# Patient Record
Sex: Male | Born: 1965 | Race: Black or African American | Hispanic: No | Marital: Single | Smoking: Never smoker
Health system: Southern US, Community
[De-identification: ages and names within clinical notes are randomized; demographics above are authoritative.]

## PROBLEM LIST (undated history)

## (undated) HISTORY — PX: KNEE SURGERY: SHX244

---

## 2019-03-24 ENCOUNTER — Emergency Department (HOSPITAL_COMMUNITY): Payer: Medicare (Managed Care)

## 2019-03-24 ENCOUNTER — Encounter (HOSPITAL_COMMUNITY): Payer: Self-pay | Admitting: Emergency Medicine

## 2019-03-24 ENCOUNTER — Other Ambulatory Visit: Payer: Self-pay

## 2019-03-24 ENCOUNTER — Emergency Department (HOSPITAL_COMMUNITY)
Admission: EM | Admit: 2019-03-24 | Discharge: 2019-03-24 | Disposition: A | Discharge status: Home / Self Care | Payer: Medicare (Managed Care) | Attending: Emergency Medicine | Admitting: Emergency Medicine

## 2019-03-24 DIAGNOSIS — M79671 Pain in right foot: Secondary | ICD-10-CM | POA: Insufficient documentation

## 2019-03-24 DIAGNOSIS — M19071 Primary osteoarthritis, right ankle and foot: Secondary | ICD-10-CM | POA: Diagnosis not present

## 2019-03-24 MED ORDER — IBUPROFEN 400 MG PO TABS
400.0000 mg | ORAL_TABLET | Freq: Once | ORAL | Status: AC
  Administered 2019-03-24: 400 mg via ORAL
  Filled 2019-03-24: qty 1

## 2019-03-24 MED ORDER — IBUPROFEN 600 MG PO TABS: 600.0000 mg | ORAL_TABLET | Freq: Four times a day (QID) | ORAL | 0 refills | Status: DC | PRN

## 2019-03-24 NOTE — ED Notes (Signed)
Pt ambulated to the restroom without assistance

## 2019-03-24 NOTE — ED Triage Notes (Signed)
Pt. Stated, My rt. Heel of my foot has been hurting for 2 weeks.

## 2019-03-24 NOTE — ED Provider Notes (Signed)
[facility] EMERGENCY DEPARTMENT Provider Note   CSN: [id number] Arrival date & time: 03/24/19  1417     History   Chief Complaint Chief Complaint  Patient presents with  . Foot Pain    HPI Dalton Alvarado is a 53 y.o. male.     53 year old male presents with 2-week history of right heel pain which is been atraumatic.  Similar symptoms in the past treated with steroid injections.  Denies any history of gout.  No pain or swelling to his ankle or foot.  Pain is worse with standing and better with rest no treatment used prior to arrival.     History reviewed. No pertinent past medical history.  There are no active problems to display for this patient.   History reviewed. No pertinent surgical history.      Home Medications    Prior to Admission medications   Not on File    Family History No family history on file.  Social History Social History   Tobacco Use  . Smoking status: Never Smoker  . Smokeless tobacco: Never Used  Substance Use Topics  . Alcohol use: Yes  . Drug use: Not Currently     Allergies   Patient has no allergy information on record.   Review of Systems Review of Systems  All other systems reviewed and are negative.    Physical Exam Updated Vital Signs BP (!) 128/96 (BP Location: Left Arm)   Pulse 87   Temp 98.3 F (36.8 C)   Resp 16   SpO2 100%   Physical Exam Vitals signs and nursing note reviewed.  Constitutional:      Appearance: He is well-developed. He is not toxic-appearing.  HENT:     Head: Normocephalic and atraumatic.  Eyes:     Conjunctiva/sclera: Conjunctivae normal.     Pupils: Pupils are equal, round, and reactive to light.  Neck:     Musculoskeletal: Normal range of motion.  Cardiovascular:     Rate and Rhythm: Normal rate.  Pulmonary:     Effort: Pulmonary effort is normal.  Musculoskeletal:       Feet:     Comments: No erythema or warmness to the touch.  Skin intact.  No  swelling to the ankle joint.  Skin:    General: Skin is warm and dry.  Neurological:     Mental Status: He is alert and oriented to person, place, and time.      ED Treatments / Results  Labs (all labs ordered are listed, but only abnormal results are displayed) Labs Reviewed - No data to display  EKG None  Radiology Dg Foot Complete Right  Result Date: 03/24/2019 CLINICAL DATA:  Pain in heel for 2 weeks.  No injury. EXAM: RIGHT FOOT COMPLETE - 3+ VIEW COMPARISON:  No P FINDINGS: Diffuse degenerative change. Degenerative changes most prominent about the first metatarsophalangeal joint. No acute bony abnormality identified. Calcaneus is intact. No radiopaque foreign body. IMPRESSION: Diffuse degenerative change. Degenerative changes most prior about the first metatarsophalangeal joint. No acute abnormality. The calcaneus appears unremarkable. Electronically Signed   By: Marcello Moores  Register   On: 03/24/2019 16:19    Procedures Procedures (including critical care time)  Medications Ordered in ED Medications - No data to display   Initial Impression / Assessment and Plan / ED Course  I have reviewed the triage vital signs and the nursing notes.  Pertinent labs & imaging results that were available during my care of the patient  were reviewed by me and considered in my medical decision making (see chart for details).        X-rays negative.  Will prescribe NSAIDs and give orthopedic referral  Final Clinical Impressions(s) / ED Diagnoses   Final diagnoses:  None    ED Discharge Orders    None       Lacretia Leigh, MD 03/24/19 [phone]

## 2019-05-02 ENCOUNTER — Encounter (HOSPITAL_COMMUNITY): Payer: Self-pay | Admitting: *Deleted

## 2019-05-02 ENCOUNTER — Emergency Department (HOSPITAL_COMMUNITY)
Admission: EM | Admit: 2019-05-02 | Discharge: 2019-05-02 | Disposition: A | Discharge status: Home / Self Care | Payer: PRIVATE HEALTH INSURANCE | Attending: Emergency Medicine | Admitting: Emergency Medicine

## 2019-05-02 ENCOUNTER — Other Ambulatory Visit: Payer: Self-pay

## 2019-05-02 DIAGNOSIS — H1132 Conjunctival hemorrhage, left eye: Secondary | ICD-10-CM | POA: Diagnosis not present

## 2019-05-02 DIAGNOSIS — Y929 Unspecified place or not applicable: Secondary | ICD-10-CM | POA: Insufficient documentation

## 2019-05-02 DIAGNOSIS — R2242 Localized swelling, mass and lump, left lower limb: Secondary | ICD-10-CM | POA: Diagnosis not present

## 2019-05-02 DIAGNOSIS — M79662 Pain in left lower leg: Secondary | ICD-10-CM | POA: Insufficient documentation

## 2019-05-02 DIAGNOSIS — Y999 Unspecified external cause status: Secondary | ICD-10-CM | POA: Insufficient documentation

## 2019-05-02 DIAGNOSIS — Y9389 Activity, other specified: Secondary | ICD-10-CM | POA: Diagnosis not present

## 2019-05-02 MED ORDER — OXYCODONE-ACETAMINOPHEN 5-325 MG PO TABS: 1.0000 | ORAL_TABLET | Freq: Four times a day (QID) | ORAL | 0 refills | Status: DC | PRN

## 2019-05-02 MED ORDER — ENOXAPARIN SODIUM 120 MG/0.8ML ~~LOC~~ SOLN
110.0000 mg | Freq: Once | SUBCUTANEOUS | Status: AC
  Administered 2019-05-02: 110 mg via SUBCUTANEOUS
  Filled 2019-05-02: qty 0.73

## 2019-05-02 MED ORDER — OXYCODONE-ACETAMINOPHEN 5-325 MG PO TABS
1.0000 | ORAL_TABLET | Freq: Once | ORAL | Status: AC
  Administered 2019-05-02: 1 via ORAL
  Filled 2019-05-02: qty 1
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
## 2019-05-03 ENCOUNTER — Encounter (HOSPITAL_COMMUNITY): Payer: Self-pay | Admitting: Emergency Medicine

## 2019-05-03 ENCOUNTER — Ambulatory Visit (HOSPITAL_COMMUNITY)
Admission: RE | Admit: 2019-05-03 | Discharge: 2019-05-03 | Disposition: A | Discharge status: Home / Self Care | Payer: Medicare (Managed Care) | Source: Ambulatory Visit | Attending: Emergency Medicine | Admitting: Emergency Medicine

## 2019-05-03 ENCOUNTER — Emergency Department (HOSPITAL_COMMUNITY)
Admission: EM | Admit: 2019-05-03 | Discharge: 2019-05-03 | Disposition: A | Discharge status: Home / Self Care | Payer: Medicare (Managed Care) | Attending: Emergency Medicine | Admitting: Emergency Medicine

## 2019-05-03 DIAGNOSIS — Y999 Unspecified external cause status: Secondary | ICD-10-CM | POA: Diagnosis not present

## 2019-05-03 DIAGNOSIS — S86912A Strain of unspecified muscle(s) and tendon(s) at lower leg level, left leg, initial encounter: Secondary | ICD-10-CM | POA: Diagnosis not present

## 2019-05-03 DIAGNOSIS — M7989 Other specified soft tissue disorders: Secondary | ICD-10-CM | POA: Diagnosis not present

## 2019-05-03 DIAGNOSIS — M79604 Pain in right leg: Secondary | ICD-10-CM | POA: Diagnosis present

## 2019-05-03 DIAGNOSIS — M79609 Pain in unspecified limb: Secondary | ICD-10-CM

## 2019-05-03 DIAGNOSIS — S8992XA Unspecified injury of left lower leg, initial encounter: Secondary | ICD-10-CM | POA: Diagnosis present

## 2019-05-03 DIAGNOSIS — Y929 Unspecified place or not applicable: Secondary | ICD-10-CM | POA: Insufficient documentation

## 2019-05-03 DIAGNOSIS — Y939 Activity, unspecified: Secondary | ICD-10-CM | POA: Diagnosis not present

## 2019-05-03 DIAGNOSIS — T148XXA Other injury of unspecified body region, initial encounter: Secondary | ICD-10-CM

## 2019-05-03 DIAGNOSIS — M79605 Pain in left leg: Secondary | ICD-10-CM

## 2019-05-03 NOTE — Discharge Instructions (Addendum)
You have been diagnosed today with left leg pain, muscle strain.  At this time there does not appear to be the presence of an emergent medical condition, however there is always the potential for conditions to change. Please read and follow the below instructions.  Please return to the Emergency Department immediately for any new or worsening symptoms. Please be sure to follow up with your Primary Care Provider within one week regarding your visit today; please call their office to schedule an appointment even if you are feeling better for a follow-up visit. Please use rest, ice and elevation to help with your symptoms.  Call your primary care doctor to establish a follow-up visit within 1 week.  You may use over-the-counter Tylenol and ibuprofen as directed on the packaging to help with your symptoms.  Get help right away if: You have any of these problems in your injured area: You have numbness. You have tingling. You lose a lot of strength. You have fever or chills You have redness or drainage You have any new/concerning or worsening symptoms.  Please read the additional information packets attached to your discharge summary.  Do not take your medicine if  develop an itchy rash, swelling in your mouth or lips, or difficulty breathing; call 911 and seek immediate emergency medical attention if this occurs.

## 2019-05-03 NOTE — ED Triage Notes (Signed)
Pt. Stated, I was here yesrterday for left lower leg pain , had a study this morning and it was negative. I would like to have some blood test.
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
## 2019-05-03 NOTE — Progress Notes (Signed)
Lower extremity venous has been completed.   Preliminary results in CV Proc.   Abram Sander 05/03/2019 11:15 AM

## 2019-05-03 NOTE — ED Notes (Signed)
Patient verbalizes understanding of discharge instructions. Opportunity for questioning and answers were provided. Armband removed by staff, pt discharged from ED.  

## 2019-06-05 DIAGNOSIS — M25571 Pain in right ankle and joints of right foot: Secondary | ICD-10-CM | POA: Diagnosis not present

## 2019-06-05 DIAGNOSIS — M25561 Pain in right knee: Secondary | ICD-10-CM | POA: Diagnosis not present

## 2019-06-11 DIAGNOSIS — M722 Plantar fascial fibromatosis: Secondary | ICD-10-CM | POA: Diagnosis not present

## 2019-06-26 DIAGNOSIS — R072 Precordial pain: Secondary | ICD-10-CM | POA: Diagnosis not present

## 2019-06-26 DIAGNOSIS — Z1389 Encounter for screening for other disorder: Secondary | ICD-10-CM | POA: Diagnosis not present

## 2019-06-26 DIAGNOSIS — Z0000 Encounter for general adult medical examination without abnormal findings: Secondary | ICD-10-CM | POA: Diagnosis not present

## 2019-06-26 DIAGNOSIS — Z23 Encounter for immunization: Secondary | ICD-10-CM | POA: Diagnosis not present

## 2019-06-26 DIAGNOSIS — Z114 Encounter for screening for human immunodeficiency virus [HIV]: Secondary | ICD-10-CM | POA: Diagnosis not present

## 2019-06-26 DIAGNOSIS — Z1159 Encounter for screening for other viral diseases: Secondary | ICD-10-CM | POA: Diagnosis not present

## 2019-06-26 DIAGNOSIS — Z131 Encounter for screening for diabetes mellitus: Secondary | ICD-10-CM | POA: Diagnosis not present

## 2019-06-26 DIAGNOSIS — R5383 Other fatigue: Secondary | ICD-10-CM | POA: Diagnosis not present

## 2019-06-26 DIAGNOSIS — R0602 Shortness of breath: Secondary | ICD-10-CM | POA: Diagnosis not present

## 2019-06-26 DIAGNOSIS — Z7251 High risk heterosexual behavior: Secondary | ICD-10-CM | POA: Diagnosis not present

## 2019-06-26 DIAGNOSIS — A048 Other specified bacterial intestinal infections: Secondary | ICD-10-CM | POA: Diagnosis not present

## 2019-06-26 DIAGNOSIS — Z03818 Encounter for observation for suspected exposure to other biological agents ruled out: Secondary | ICD-10-CM | POA: Diagnosis not present

## 2019-06-26 DIAGNOSIS — G629 Polyneuropathy, unspecified: Secondary | ICD-10-CM | POA: Diagnosis not present

## 2019-06-26 DIAGNOSIS — E559 Vitamin D deficiency, unspecified: Secondary | ICD-10-CM | POA: Diagnosis not present

## 2019-06-26 DIAGNOSIS — E291 Testicular hypofunction: Secondary | ICD-10-CM | POA: Diagnosis not present

## 2019-06-26 DIAGNOSIS — Z125 Encounter for screening for malignant neoplasm of prostate: Secondary | ICD-10-CM | POA: Diagnosis not present

## 2019-07-03 DIAGNOSIS — Z79899 Other long term (current) drug therapy: Secondary | ICD-10-CM | POA: Diagnosis not present

## 2019-07-03 DIAGNOSIS — M25562 Pain in left knee: Secondary | ICD-10-CM | POA: Diagnosis not present

## 2019-07-03 DIAGNOSIS — F17210 Nicotine dependence, cigarettes, uncomplicated: Secondary | ICD-10-CM | POA: Diagnosis not present

## 2019-07-03 DIAGNOSIS — M129 Arthropathy, unspecified: Secondary | ICD-10-CM | POA: Diagnosis not present

## 2019-07-03 DIAGNOSIS — M25561 Pain in right knee: Secondary | ICD-10-CM | POA: Diagnosis not present

## 2019-07-08 DIAGNOSIS — R768 Other specified abnormal immunological findings in serum: Secondary | ICD-10-CM | POA: Diagnosis not present

## 2019-07-08 DIAGNOSIS — R140 Abdominal distension (gaseous): Secondary | ICD-10-CM | POA: Diagnosis not present

## 2019-07-08 DIAGNOSIS — Z1211 Encounter for screening for malignant neoplasm of colon: Secondary | ICD-10-CM | POA: Diagnosis not present

## 2019-07-10 DIAGNOSIS — E7800 Pure hypercholesterolemia, unspecified: Secondary | ICD-10-CM | POA: Diagnosis not present

## 2019-07-10 DIAGNOSIS — E559 Vitamin D deficiency, unspecified: Secondary | ICD-10-CM | POA: Diagnosis not present

## 2019-07-10 DIAGNOSIS — F17210 Nicotine dependence, cigarettes, uncomplicated: Secondary | ICD-10-CM | POA: Diagnosis not present

## 2019-07-10 DIAGNOSIS — R768 Other specified abnormal immunological findings in serum: Secondary | ICD-10-CM | POA: Diagnosis not present

## 2019-07-16 DIAGNOSIS — Z01818 Encounter for other preprocedural examination: Secondary | ICD-10-CM | POA: Diagnosis not present

## 2019-07-16 DIAGNOSIS — K635 Polyp of colon: Secondary | ICD-10-CM | POA: Diagnosis not present

## 2019-07-16 DIAGNOSIS — Z1211 Encounter for screening for malignant neoplasm of colon: Secondary | ICD-10-CM | POA: Diagnosis not present

## 2019-07-21 ENCOUNTER — Other Ambulatory Visit: Payer: Self-pay

## 2019-07-21 DIAGNOSIS — Z20822 Contact with and (suspected) exposure to covid-19: Secondary | ICD-10-CM

## 2019-07-22 LAB — NOVEL CORONAVIRUS, NAA: SARS-CoV-2, NAA: NOT DETECTED

## 2019-07-23 DIAGNOSIS — R945 Abnormal results of liver function studies: Secondary | ICD-10-CM | POA: Diagnosis not present

## 2019-07-23 DIAGNOSIS — K635 Polyp of colon: Secondary | ICD-10-CM | POA: Diagnosis not present

## 2019-07-23 DIAGNOSIS — Z79899 Other long term (current) drug therapy: Secondary | ICD-10-CM | POA: Diagnosis not present

## 2019-07-23 DIAGNOSIS — F17210 Nicotine dependence, cigarettes, uncomplicated: Secondary | ICD-10-CM | POA: Diagnosis not present

## 2019-07-23 DIAGNOSIS — M25562 Pain in left knee: Secondary | ICD-10-CM | POA: Diagnosis not present

## 2019-07-23 DIAGNOSIS — M25561 Pain in right knee: Secondary | ICD-10-CM | POA: Diagnosis not present

## 2019-07-23 DIAGNOSIS — B182 Chronic viral hepatitis C: Secondary | ICD-10-CM | POA: Diagnosis not present

## 2019-08-05 DIAGNOSIS — R768 Other specified abnormal immunological findings in serum: Secondary | ICD-10-CM | POA: Diagnosis not present

## 2019-08-05 DIAGNOSIS — B182 Chronic viral hepatitis C: Secondary | ICD-10-CM | POA: Diagnosis not present

## 2019-08-05 DIAGNOSIS — D126 Benign neoplasm of colon, unspecified: Secondary | ICD-10-CM | POA: Diagnosis not present

## 2019-08-27 DIAGNOSIS — F17210 Nicotine dependence, cigarettes, uncomplicated: Secondary | ICD-10-CM | POA: Diagnosis not present

## 2019-08-27 DIAGNOSIS — Z79899 Other long term (current) drug therapy: Secondary | ICD-10-CM | POA: Diagnosis not present

## 2019-08-27 DIAGNOSIS — M25561 Pain in right knee: Secondary | ICD-10-CM | POA: Diagnosis not present

## 2019-08-27 DIAGNOSIS — M25562 Pain in left knee: Secondary | ICD-10-CM | POA: Diagnosis not present

## 2019-08-27 DIAGNOSIS — B182 Chronic viral hepatitis C: Secondary | ICD-10-CM | POA: Diagnosis not present

## 2019-08-27 DIAGNOSIS — F1490 Cocaine use, unspecified, uncomplicated: Secondary | ICD-10-CM | POA: Diagnosis not present

## 2019-09-24 DIAGNOSIS — B182 Chronic viral hepatitis C: Secondary | ICD-10-CM | POA: Diagnosis not present

## 2019-09-25 DIAGNOSIS — M170 Bilateral primary osteoarthritis of knee: Secondary | ICD-10-CM | POA: Diagnosis not present

## 2020-02-14 ENCOUNTER — Emergency Department (HOSPITAL_COMMUNITY): Payer: Medicare HMO

## 2020-02-14 ENCOUNTER — Encounter (HOSPITAL_COMMUNITY): Payer: Self-pay | Admitting: *Deleted

## 2020-02-14 ENCOUNTER — Emergency Department (HOSPITAL_COMMUNITY)
Admission: EM | Admit: 2020-02-14 | Discharge: 2020-02-15 | Disposition: A | Discharge status: Home / Self Care | Payer: Medicare HMO | Attending: Emergency Medicine | Admitting: Emergency Medicine

## 2020-02-14 ENCOUNTER — Other Ambulatory Visit: Payer: Self-pay

## 2020-02-14 DIAGNOSIS — Y9389 Activity, other specified: Secondary | ICD-10-CM | POA: Diagnosis not present

## 2020-02-14 DIAGNOSIS — Y998 Other external cause status: Secondary | ICD-10-CM | POA: Diagnosis not present

## 2020-02-14 DIAGNOSIS — M25561 Pain in right knee: Secondary | ICD-10-CM | POA: Diagnosis not present

## 2020-02-14 DIAGNOSIS — M7918 Myalgia, other site: Secondary | ICD-10-CM

## 2020-02-14 DIAGNOSIS — Y92410 Unspecified street and highway as the place of occurrence of the external cause: Secondary | ICD-10-CM | POA: Insufficient documentation

## 2020-02-14 DIAGNOSIS — M25512 Pain in left shoulder: Secondary | ICD-10-CM | POA: Diagnosis present

## 2020-02-14 DIAGNOSIS — Z79899 Other long term (current) drug therapy: Secondary | ICD-10-CM | POA: Diagnosis not present

## 2020-02-14 MED ORDER — KETOROLAC TROMETHAMINE 15 MG/ML IJ SOLN
15.0000 mg | Freq: Once | INTRAMUSCULAR | Status: DC
  Filled 2020-02-14: qty 1

## 2020-02-14 MED ORDER — HYDROMORPHONE HCL 1 MG/ML IJ SOLN
1.0000 mg | Freq: Once | INTRAMUSCULAR | Status: AC
  Administered 2020-02-14: 1 mg via INTRAVENOUS
  Filled 2020-02-14: qty 1

## 2020-02-14 MED ORDER — CYCLOBENZAPRINE HCL 10 MG PO TABS: 10.0000 mg | ORAL_TABLET | Freq: Three times a day (TID) | ORAL | 0 refills | Status: AC | PRN

## 2020-02-14 MED ORDER — KETOROLAC TROMETHAMINE 15 MG/ML IJ SOLN
15.0000 mg | Freq: Once | INTRAMUSCULAR | Status: AC
  Administered 2020-02-14: 15 mg via INTRAVENOUS

## 2020-02-14 NOTE — ED Notes (Signed)
Pt. Transported to xray 
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
## 2020-02-14 NOTE — Discharge Instructions (Signed)
Take ibuprofen 600 mg every 6 hours as needed for pain. Take flexeril as needed as a muscle relaxant.
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
## 2020-04-12 ENCOUNTER — Encounter: Payer: Self-pay | Admitting: Emergency Medicine

## 2020-04-12 ENCOUNTER — Other Ambulatory Visit: Payer: Self-pay

## 2020-04-12 ENCOUNTER — Emergency Department: Payer: Medicare HMO

## 2020-04-12 ENCOUNTER — Emergency Department
Admission: EM | Admit: 2020-04-12 | Discharge: 2020-04-12 | Disposition: A | Discharge status: Home / Self Care | Payer: Medicare HMO | Attending: Emergency Medicine | Admitting: Emergency Medicine

## 2020-04-12 DIAGNOSIS — T40601A Poisoning by unspecified narcotics, accidental (unintentional), initial encounter: Secondary | ICD-10-CM | POA: Insufficient documentation

## 2020-04-12 DIAGNOSIS — Z859 Personal history of malignant neoplasm, unspecified: Secondary | ICD-10-CM | POA: Insufficient documentation

## 2020-04-12 DIAGNOSIS — R464 Slowness and poor responsiveness: Secondary | ICD-10-CM | POA: Diagnosis present

## 2020-04-12 LAB — CBC
HCT: 35.9 % — ABNORMAL LOW (ref 39.0–52.0)
Hemoglobin: 11 g/dL — ABNORMAL LOW (ref 13.0–17.0)
MCH: 27.5 pg (ref 26.0–34.0)
MCHC: 30.6 g/dL (ref 30.0–36.0)
MCV: 89.8 fL (ref 80.0–100.0)
Platelets: 110 10*3/uL — ABNORMAL LOW (ref 150–400)
RBC: 4 MIL/uL — ABNORMAL LOW (ref 4.22–5.81)
RDW: 14.6 % (ref 11.5–15.5)
WBC: 5 10*3/uL (ref 4.0–10.5)
nRBC: 0 % (ref 0.0–0.2)

## 2020-04-12 LAB — URINALYSIS, COMPLETE (UACMP) WITH MICROSCOPIC
Bacteria, UA: NONE SEEN
Bilirubin Urine: NEGATIVE
Glucose, UA: NEGATIVE mg/dL
Hgb urine dipstick: NEGATIVE
Ketones, ur: NEGATIVE mg/dL
Leukocytes,Ua: NEGATIVE
Nitrite: NEGATIVE
Protein, ur: NEGATIVE mg/dL
Specific Gravity, Urine: 1.004 — ABNORMAL LOW (ref 1.005–1.030)
Squamous Epithelial / HPF: NONE SEEN (ref 0–5)
pH: 7 (ref 5.0–8.0)

## 2020-04-12 LAB — COMPREHENSIVE METABOLIC PANEL
ALT: 17 U/L (ref 0–44)
AST: 24 U/L (ref 15–41)
Albumin: 3.7 g/dL (ref 3.5–5.0)
Alkaline Phosphatase: 52 U/L (ref 38–126)
Anion gap: 11 (ref 5–15)
BUN: 10 mg/dL (ref 6–20)
CO2: 27 mmol/L (ref 22–32)
Calcium: 8.5 mg/dL — ABNORMAL LOW (ref 8.9–10.3)
Chloride: 100 mmol/L (ref 98–111)
Creatinine, Ser: 1.18 mg/dL (ref 0.61–1.24)
GFR calc Af Amer: >60 mL/min (ref >60)
GFR calc non Af Amer: >60 mL/min (ref >60)
Glucose, Bld: 153 mg/dL — ABNORMAL HIGH (ref 70–99)
Potassium: 3.9 mmol/L (ref 3.5–5.1)
Sodium: 138 mmol/L (ref 135–145)
Total Bilirubin: 0.7 mg/dL (ref 0.3–1.2)
Total Protein: 6.1 g/dL — ABNORMAL LOW (ref 6.5–8.1)

## 2020-04-12 MED ORDER — SODIUM CHLORIDE 0.9 % IV BOLUS
1000.0000 mL | Freq: Once | INTRAVENOUS | Status: AC
  Administered 2020-04-12: 1000 mL via INTRAVENOUS

## 2020-04-12 NOTE — ED Notes (Signed)
Pt using urinal.

## 2020-04-12 NOTE — ED Provider Notes (Signed)
[facility] Emergency Department Provider Note  Time seen: 7:52 AM  I have reviewed the triage vital signs and the nursing notes.   HISTORY  Chief Complaint unresponsive   HPI Dalton Alvarado is a 54 y.o. 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  According to EMS patient was found unresponsive at home by family members on the floor.  Patient was noted to be on methadone.  EMS states upon their arrival he was completely unresponsive and was given 1 mg of intranasal Narcan.  In route to the hospital patient became responsive.  Initially upon arrival patient is somnolent but responding to light stimuli or strong verbal stimuli.  Within approximately 5 minutes of arrival patient is sitting up in bed asking to go to the bathroom.  Patient denies any complaints.  Largely negative review of systems.  Does not recall falling.  History reviewed. No pertinent past medical history.  There are no problems to display for this patient.   Past Surgical History:  Procedure Laterality Date  . KNEE SURGERY      Prior to Admission medications   Medication Sig Start Date End Date Taking? Authorizing Provider  Cholecalciferol (VITAMIN D3) 1.25 MG (50000 UT) CAPS Take 1 capsule by mouth once a week. 02/05/20   [provider]  cyclobenzaprine (FLEXERIL) 10 MG tablet Take 1 tablet (10 mg total) by mouth 3 (three) times daily as needed for muscle spasms. 02/14/20   Virgel Manifold, MD  EPCLUSA 400-100 MG TABS Take 1 tablet by mouth daily. 12/29/19   [provider]  ibuprofen (ADVIL) 600 MG tablet Take 1 tablet (600 mg total) by mouth every 6 (six) hours as needed. Patient not taking: Reported on 02/14/2020 03/24/19   Lacretia Leigh, MD  oxyCODONE-acetaminophen (PERCOCET/ROXICET) 5-325 MG tablet Take 1 tablet by mouth every 6 (six) hours as needed for severe pain. 05/02/19   Ashley Murrain, NP  sildenafil (VIAGRA) 50 MG tablet Take 50 mg by mouth as needed for erectile dysfunction. 12/17/19   [provider]    No Known Allergies  History reviewed. No pertinent family history.  Social History Social History   Tobacco Use  . Smoking status: Never Smoker  . Smokeless tobacco: Never Used  Substance Use Topics  . Alcohol use: Yes  . Drug use: Not Currently    Review of Systems Constitutional: Negative for fever. Cardiovascular: Negative for chest pain. Respiratory: Negative for shortness of breath. Gastrointestinal: Negative for abdominal pain Musculoskeletal: Negative for musculoskeletal complaints Neurological: Negative for headache All other ROS negative  ____________________________________________   PHYSICAL EXAM:  VITAL SIGNS: ED Triage Vitals [04/12/20 0728]  Enc Vitals Group     BP 125/82     Pulse Rate 89     Resp 13     Temp 98.5 F (36.9 C)     Temp Source Oral     SpO2 96 %     Weight      Height      Head Circumference      Peak Flow      Pain Score Asleep     Pain Loc      Pain Edu?      Excl. in [place]?    Constitutional: Alert and oriented. Well appearing and in no distress. Eyes: Normal exam ENT      Head: Normocephalic and atraumatic.      Mouth/Throat: Mucous  membranes are moist. Cardiovascular: Normal rate, regular rhythm.  Respiratory: Normal respiratory effort without tachypnea nor retractions. Breath sounds are clear  Gastrointestinal: Soft and nontender. No distention.   Musculoskeletal: Nontender with normal range of motion in all extremities.  Neurologic:  Normal speech and language. No gross focal neurologic deficits  Skin:  Skin is warm, dry and intact.  Psychiatric: Mood and affect are normal.  ____________________________________________    EKG  EKG viewed and interpreted by myself shows a normal sinus rhythm at 95 bpm with a narrow QRS, normal axis, normal intervals, no concerning ST  changes.  ____________________________________________    RADIOLOGY  CT head negative for acute abnormality.  ____________________________________________   INITIAL IMPRESSION / ASSESSMENT AND PLAN / ED COURSE  Pertinent labs & imaging results that were available during my care of the patient were reviewed by me and considered in my medical decision making (see chart for details).   Patient presents to the emergency department after being found down at home.  After 1 mg of intranasal Narcan patient is now awake and responsive in the emergency department with no complaints.  We will check labs, CT scan of the head as a precaution as well as a urine sample.  We will continue to closely monitor given the patient received short acting Narcan and is apparently on long-acting methadone.  CT scan is negative for acute abnormality.  Lab work largely [place].  Patient has requested multiple times now to leave the emergency department.  Patient was initially somnolent however is now been awake for several hours.  Patient's family is here with him who will take him home and watch him.  We will discharge from the emergency department.  Dalton Alvarado was evaluated in Emergency Department on 04/12/2020 for the symptoms described in the history of present illness. He was evaluated in the context of the global COVID-19 pandemic, which necessitated consideration that the patient might be at risk for infection with the SARS-CoV-2 virus that causes COVID-19. Institutional protocols and algorithms that pertain to the evaluation of patients at risk for COVID-19 are in a state of rapid change based on information released by regulatory bodies including the CDC and federal and state organizations. These policies and algorithms were followed during the patient's care in the ED.  ____________________________________________   FINAL CLINICAL IMPRESSION(S) / ED DIAGNOSES  Unresponsiveness Accidental opiate  overdose   Dalton Dark, MD 04/12/20 1158

## 2020-04-12 NOTE — ED Notes (Signed)
Assisted pt with urinal

## 2020-04-12 NOTE — ED Notes (Signed)
Pt using urinal.  Keeps falling asleep while using then wakes up and has to urinate again.  Family called for pt, will have pt call family or RN will once pt able to give RN permission to speak with them.
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
## 2020-04-12 NOTE — Discharge Instructions (Addendum)
You have been seen in the emergency department today for unresponsiveness.  Your condition is very likely due to an accidental overdose of pain medication.  Please only take your pain medication as prescribed by your provider.  Do not drink alcohol or drive while taking pain medication.  Return to the emergency department for any symptoms personally concerning to yourself or family members.

## 2020-04-12 NOTE — ED Notes (Signed)
Pt assisted with urinal

## 2020-04-12 NOTE — ED Notes (Signed)
Patient transported to CT 

## 2020-08-05 ENCOUNTER — Other Ambulatory Visit: Payer: Self-pay | Admitting: Family Medicine

## 2020-08-05 DIAGNOSIS — R1033 Periumbilical pain: Secondary | ICD-10-CM

## 2020-10-12 ENCOUNTER — Other Ambulatory Visit: Payer: Self-pay | Admitting: Family Medicine

## 2020-10-12 DIAGNOSIS — S43402A Unspecified sprain of left shoulder joint, initial encounter: Secondary | ICD-10-CM

## 2021-03-23 ENCOUNTER — Other Ambulatory Visit: Payer: Self-pay

## 2021-03-23 ENCOUNTER — Ambulatory Visit
Admission: RE | Admit: 2021-03-23 | Discharge: 2021-03-23 | Disposition: A | Discharge status: Home / Self Care | Payer: Medicare HMO | Source: Ambulatory Visit | Attending: Internal Medicine | Admitting: Internal Medicine

## 2021-03-23 DIAGNOSIS — N281 Cyst of kidney, acquired: Secondary | ICD-10-CM
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
## 2022-03-21 ENCOUNTER — Ambulatory Visit: Payer: No Typology Code available for payment source | Admitting: Podiatry

## 2022-05-27 ENCOUNTER — Encounter (HOSPITAL_COMMUNITY): Payer: Self-pay | Admitting: Emergency Medicine

## 2022-05-27 ENCOUNTER — Emergency Department (HOSPITAL_COMMUNITY)
Admission: EM | Admit: 2022-05-27 | Discharge: 2022-05-27 | Discharge status: Against Medical Advice | Payer: Medicare Other | Attending: Emergency Medicine | Admitting: Emergency Medicine

## 2022-05-27 ENCOUNTER — Other Ambulatory Visit: Payer: Self-pay

## 2022-05-27 ENCOUNTER — Emergency Department (HOSPITAL_COMMUNITY): Payer: Medicare Other

## 2022-05-27 DIAGNOSIS — Y92410 Unspecified street and highway as the place of occurrence of the external cause: Secondary | ICD-10-CM | POA: Diagnosis not present

## 2022-05-27 DIAGNOSIS — Z5321 Procedure and treatment not carried out due to patient leaving prior to being seen by health care provider: Secondary | ICD-10-CM | POA: Insufficient documentation

## 2022-05-27 DIAGNOSIS — M542 Cervicalgia: Secondary | ICD-10-CM | POA: Diagnosis present

## 2022-05-27 DIAGNOSIS — M545 Low back pain, unspecified: Secondary | ICD-10-CM | POA: Diagnosis not present

## 2022-05-27 DIAGNOSIS — M25512 Pain in left shoulder: Secondary | ICD-10-CM | POA: Insufficient documentation

## 2022-05-27 DIAGNOSIS — R0781 Pleurodynia: Secondary | ICD-10-CM | POA: Insufficient documentation

## 2022-05-27 NOTE — ED Notes (Signed)
Pt left due to not being seen quick enough 
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
## 2022-08-07 ENCOUNTER — Ambulatory Visit (INDEPENDENT_AMBULATORY_CARE_PROVIDER_SITE_OTHER): Payer: Medicare Other | Admitting: Podiatry

## 2022-08-07 DIAGNOSIS — Z91199 Patient's noncompliance with other medical treatment and regimen due to unspecified reason: Secondary | ICD-10-CM

## 2022-08-07 NOTE — Progress Notes (Signed)
Patient was no-show for appointment today 

## 2022-08-09 ENCOUNTER — Other Ambulatory Visit: Payer: Self-pay | Admitting: Podiatry

## 2022-08-09 ENCOUNTER — Ambulatory Visit (INDEPENDENT_AMBULATORY_CARE_PROVIDER_SITE_OTHER): Payer: Medicare Other

## 2022-08-09 ENCOUNTER — Ambulatory Visit (INDEPENDENT_AMBULATORY_CARE_PROVIDER_SITE_OTHER): Payer: Medicare Other | Admitting: Podiatry

## 2022-08-09 DIAGNOSIS — M21619 Bunion of unspecified foot: Secondary | ICD-10-CM

## 2022-08-09 DIAGNOSIS — M2011 Hallux valgus (acquired), right foot: Secondary | ICD-10-CM | POA: Diagnosis not present

## 2022-08-09 DIAGNOSIS — Z79899 Other long term (current) drug therapy: Secondary | ICD-10-CM | POA: Diagnosis not present

## 2022-08-09 DIAGNOSIS — M7752 Other enthesopathy of left foot: Secondary | ICD-10-CM

## 2022-08-09 DIAGNOSIS — B351 Tinea unguium: Secondary | ICD-10-CM | POA: Diagnosis not present

## 2022-08-09 DIAGNOSIS — Z01818 Encounter for other preprocedural examination: Secondary | ICD-10-CM

## 2022-08-09 DIAGNOSIS — M7751 Other enthesopathy of right foot: Secondary | ICD-10-CM | POA: Diagnosis not present

## 2022-08-09 NOTE — Progress Notes (Signed)
Subjective:  Patient ID: Dalton Alvarado, male    DOB: Jun 19, 1966,  MRN: [id number]  Chief Complaint  Patient presents with   Bunions    56 y.o. male presents with the above complaint.  Patient presents with complaint bilateral first metatarsophalangeal joint capsulitis with arthritis right greater than left side.  Patient states is painful to touch is progressive gotten worse hurts with ambulation worse with pressure he has not seen anyone else prior to seeing me for this.  He is tried shoe gear modification padding protective none of which has helped.  He would like to discuss surgical options as well.  Pain scale is 8 out of 10 dull achy in nature especially on the right side.  He also has secondary complaint of bilateral hallux thickened elongated dystrophic mycotic nail for which she would like to discuss oral medication he is tried over-the-counter topical medication which has not helped   Review of Systems: Negative except as noted in the HPI. Denies N/V/F/Ch.  No past medical history on file.  Current Outpatient Medications:    Cholecalciferol (VITAMIN D3) 1.25 MG (50000 UT) CAPS, Take 1 capsule by mouth once a week., Disp: , Rfl:    cyclobenzaprine (FLEXERIL) 10 MG tablet, Take 1 tablet (10 mg total) by mouth 3 (three) times daily as needed for muscle spasms., Disp: 15 tablet, Rfl: 0   EPCLUSA 400-100 MG TABS, Take 1 tablet by mouth daily., Disp: , Rfl:    ibuprofen (ADVIL) 600 MG tablet, Take 1 tablet (600 mg total) by mouth every 6 (six) hours as needed. (Patient not taking: Reported on 02/14/2020), Disp: 30 tablet, Rfl: 0   oxyCODONE-acetaminophen (PERCOCET/ROXICET) 5-325 MG tablet, Take 1 tablet by mouth every 6 (six) hours as needed for severe pain., Disp: 6 tablet, Rfl: 0   sildenafil (VIAGRA) 50 MG tablet, Take 50 mg by mouth as needed for erectile dysfunction., Disp: , Rfl:    terbinafine (LAMISIL) 250 MG tablet, Take 1 tablet (250 mg total) by mouth daily., Disp: 90 tablet,  Rfl: 0  Social History   Tobacco Use  Smoking Status Never  Smokeless Tobacco Never    No Known Allergies Objective:  There were no vitals filed for this visit. There is no height or weight on file to calculate BMI. Constitutional Well developed. Well nourished.  Vascular Dorsalis pedis pulses palpable bilaterally. Posterior tibial pulses palpable bilaterally. Capillary refill normal to all digits.  No cyanosis or clubbing noted. Pedal hair growth normal.  Neurologic Normal speech. Oriented to person, place, and time. Epicritic sensation to light touch grossly present bilaterally.  Dermatologic Thickened elongated dystrophic mycotic toenails x 2 bilateral hallux.  Mild pain on palpation  Orthopedic: Pain on palpation bilateral first metatarsal phalangeal joint with right greater than left side crepitus clinically appreciated limited range of motion noted hallux rigidus noted.  Intra-articular first MPJ pain noted.   Radiographs: 3 views of skeletally mature adult right foot arthritis noted to the first metatarsophalangeal joint with underlying bunion deformity.  Severe pes planovalgus noted with midfoot arthritis as well as talonavicular joint arthritis.  No other bony abnormalities identified slight brachymetatarsia noted of the fourth metatarsal Assessment:   1. Long-term use of high-risk medication   2. Bunion   3. Capsulitis of metatarsophalangeal (MTP) joint of right foot   4. Capsulitis of metatarsophalangeal (MTP) joint of left foot   5. Nail fungus   6. Onychomycosis due to dermatophyte   7. Encounter for preoperative examination for general surgical procedure  Plan:  Patient was evaluated and treated and all questions answered.  Right first metatarsophalangeal joint arthritis with underlying bilateral capsulitis MTP -All questions and concerns were discussed with the patient in extensive detail.  Patient has a right first metatarsal phalangeal joint arthritis with  underlying bunion deformity I believe patient will benefit best from correction with right first metatarsophalangeal joint fusion.  Patient had limited range of motion consistent with hallux rigidus as well.  I discussed this with the patient I discussed my preoperative intra postop plan with the patient in extensive detail he states understand like to proceed with surgery -Informed surgical risk consent was reviewed and read aloud to the patient.  I reviewed the films.  I have discussed my findings with the patient in great detail.  I have discussed all risks including but not limited to infection, stiffness, scarring, limp, disability, deformity, damage to blood vessels and nerves, numbness, poor healing, need for braces, arthritis, chronic pain, amputation, death.  All benefits and realistic expectations discussed in great detail.  I have made no promises as to the outcome.  I have provided realistic expectations.  I have offered the patient a 2nd opinion, which they have declined and assured me they preferred to proceed despite the risks -Given the amount of pain that he is currently having and to the surgery patient would benefit from a steroid injection.  Patient agrees with plan like to proceed with steroid injection -A steroid injection was performed at bilateral first MTP using 1% plain Lidocaine and 10 mg of Kenalog. This was well tolerated.    Bilateral hallux onychomycosis-Educated the patient on the etiology of onychomycosis and various treatment options associated with improving the fungal load.  I explained to the patient that there is 3 treatment options available to treat the onychomycosis including topical, p.o., laser treatment.  Patient elected to undergo p.o. options with Lamisil/terbinafine therapy.  In order for me to start the medication therapy, I explained to the patient the importance of evaluating the liver and obtaining the liver function test.  Once the liver function test comes  back normal I will start him on 69-monthcourse of Lamisil therapy.  Patient understood all risk and would like to proceed with Lamisil therapy.  I have asked the patient to immediately stop the Lamisil therapy if she has any reactions to it and call the office or go to the emergency room right away.  Patient states understanding   No follow-ups on file.

## 2022-08-10 LAB — HEPATIC FUNCTION PANEL
ALT: 25 IU/L (ref 0–44)
AST: 29 IU/L (ref 0–40)
Albumin: 4.3 g/dL (ref 3.8–4.9)
Alkaline Phosphatase: 123 IU/L — ABNORMAL HIGH (ref 44–121)
Bilirubin Total: 1 mg/dL (ref 0.0–1.2)
Bilirubin, Direct: 0.29 mg/dL (ref 0.00–0.40)
Total Protein: 6.7 g/dL (ref 6.0–8.5)

## 2022-08-11 MED ORDER — TERBINAFINE HCL 250 MG PO TABS: 250.0000 mg | ORAL_TABLET | Freq: Every day | ORAL | 0 refills | Status: AC

## 2022-08-11 NOTE — Addendum Note (Signed)
Addended by: Boneta Lucks on: 08/11/2022 09:43 AM   Modules accepted: Orders

## 2022-09-01 ENCOUNTER — Ambulatory Visit: Payer: 59 | Attending: Internal Medicine

## 2022-09-01 DIAGNOSIS — M6281 Muscle weakness (generalized): Secondary | ICD-10-CM | POA: Insufficient documentation

## 2022-09-01 DIAGNOSIS — M48061 Spinal stenosis, lumbar region without neurogenic claudication: Secondary | ICD-10-CM | POA: Insufficient documentation

## 2022-09-01 DIAGNOSIS — M5459 Other low back pain: Secondary | ICD-10-CM | POA: Insufficient documentation
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
## 2022-09-13 ENCOUNTER — Ambulatory Visit: Payer: 59

## 2022-09-13 ENCOUNTER — Other Ambulatory Visit: Payer: Self-pay

## 2022-09-13 DIAGNOSIS — M48061 Spinal stenosis, lumbar region without neurogenic claudication: Secondary | ICD-10-CM | POA: Diagnosis present

## 2022-09-13 DIAGNOSIS — M6281 Muscle weakness (generalized): Secondary | ICD-10-CM | POA: Diagnosis present

## 2022-09-13 DIAGNOSIS — M5459 Other low back pain: Secondary | ICD-10-CM | POA: Diagnosis present
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
## 2022-09-18 ENCOUNTER — Ambulatory Visit: Payer: 59

## 2022-09-20 ENCOUNTER — Ambulatory Visit: Payer: 59

## 2022-09-20 DIAGNOSIS — M6281 Muscle weakness (generalized): Secondary | ICD-10-CM

## 2022-09-20 DIAGNOSIS — M48061 Spinal stenosis, lumbar region without neurogenic claudication: Secondary | ICD-10-CM

## 2022-09-20 DIAGNOSIS — M5459 Other low back pain: Secondary | ICD-10-CM | POA: Diagnosis not present

## 2022-09-20 NOTE — Therapy (Signed)
OUTPATIENT PHYSICAL THERAPY TREATMENT NOTE   Patient Name: Dalton Alvarado MRN: [id number] DOB:01/12/1966, 57 y.o., male Today's Date: 09/20/2022  PCP: Sun, Yun, MD  REFERRING PROVIDER: Sun, Yun, MD   END OF SESSION:   PT End of Session - 09/20/22 0922     Visit Number 2    Number of Visits 12    Date for PT Re-Evaluation 11/08/22    Authorization Type [employer] MCR    PT Start Time 0922   arrived late   PT Stop Time 1000    PT Time Calculation (min) 38 min    Activity Tolerance Patient limited by pain;Other (comment)   reluctant to participate in PT   Behavior During Therapy Flat affect             History reviewed. No pertinent past medical history. Past Surgical History:  Procedure Laterality Date   KNEE SURGERY     There are no problems to display for this patient.   REFERRING DIAG: M54.50 (ICD-10-CM) - Low back pain, unspecified   THERAPY DIAG:  Other low back pain  Muscle weakness (generalized)  Spinal stenosis of lumbar region without neurogenic claudication  Rationale for Evaluation and Treatment Rehabilitation  PERTINENT HISTORY: See chart   PRECAUTIONS: None   SUBJECTIVE:                                                                                                                                                                                      SUBJECTIVE STATEMENT:  Pt presents to PT with reports of continued lower back pain. Has been fairly compliant with HEP. Ready to begin PT at this time.    PAIN:  Are you having pain?  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 There is severe neuroforaminal  stenosis of L4 with compression of the L4 nerve roots.    PATIENT SURVEYS:  FOTO 44(57 predicted)   SCREENING FOR RED FLAGS: Bowel or bladder incontinence: No   COGNITION: Overall cognitive status: Within functional limits for tasks assessed                          SENSATION: Not tested   MUSCLE LENGTH: Hamstrings: Right 80 deg; Left 80 deg     POSTURE: No Significant postural limitations   PALPATION: Marked bony prominence at L4 spinous process     LUMBAR ROM:    AROM eval  Flexion 90%  Extension 25%  Right lateral flexion 50%  Left lateral flexion 50%  Right rotation 75%  Left rotation 75%   (Blank rows = not tested)   LOWER EXTREMITY ROM:   WFL   Active  Right eval Left eval  Hip flexion      Hip extension      Hip abduction      Hip adduction      Hip internal rotation      Hip external rotation      Knee flexion      Knee extension      Ankle dorsiflexion      Ankle plantarflexion      Ankle inversion      Ankle eversion       (Blank rows = not tested)   LOWER EXTREMITY MMT:     MMT Right eval Left eval  Hip flexion 4 4  Hip extension 4 4  Hip abduction 4 4  Hip adduction      Hip internal rotation      Hip external rotation      Knee flexion 4 4  Knee extension 4 4  Ankle dorsiflexion      Ankle plantarflexion 4 4  Ankle inversion      Ankle eversion       (Blank rows = not tested)   LUMBAR SPECIAL TESTS:  Straight leg raise test: Negative, Slump test: Negative, and FABER test: Negative   FUNCTIONAL TESTS:  5 times sit to stand: 25s   GAIT: Distance walked: 75ftx2 Assistive device utilized: None Level of assistance: Complete Independence Comments: slow cadence   TREATMENT: [facility] Adult PT Treatment:                                                DATE: 09/20/2022 Therapeutic Exercise: NuStep lvl 5 UE/LE x 5 min while taking subjective Physioball rollout x 10 fwd Bridge 2x10 Modified thomas stretch x 60" each Supine clamshell  2x20 black TB Supine march 2x20 black TB Supine sciatic nerve glide x 15    PATIENT EDUCATION:  Education details: continue HEP Person educated: Patient Education method: Explanation Education comprehension: verbalized understanding and needs further education   HOME EXERCISE PROGRAM: Access Code: [email or web address] URL: [email or web address] Date: 09/20/2022 Prepared by: Asa Fath  Exercises - Curl Up with Arms Crossed  - 2 x daily - 5 x weekly - 2 sets - 10 reps - Supine 90/90 Abdominal Bracing  - 2 x daily - 5 x weekly - 2 sets - 2 reps - 30s hold - Supine Bridge  - 2 x daily - 5 x weekly - 2 sets - 10 reps - Supine Double Knee to Chest  - 1 x daily - 7 x weekly - 2-3 reps - 30 sec hold - Modified Thomas Stretch  - 1 x daily - 7 x weekly - 2-3 reps - 60 sec hold - Supine Sciatic Nerve Glide  - 1 x daily - 7 x weekly - 2 sets - 10 reps   ASSESSMENT:   CLINICAL IMPRESSION: Pt was able to complete prescribed exercises with no adverse effect. Therapy focused on improving core and proximal hip strength as well as lumbar flexion stretching to reduce pain and improve comfort. HEP updated, will continue to progress as able   per POC.    OBJECTIVE IMPAIRMENTS: Abnormal gait, decreased activity tolerance, decreased endurance, decreased knowledge of condition, decreased mobility, decreased ROM, decreased strength, and pain.    ACTIVITY LIMITATIONS: carrying, lifting, bending, and standing   REHAB POTENTIAL: Fair based on chronicity and reluctance to undergo recommended surgical correction     GOALS: Goals reviewed with patient? No   SHORT TERM GOALS: Target date: 10/04/2022     Patient to demonstrate independence in HEP Baseline: [email or web address] Goal status: INITIAL   2.  Decrease 5x STS to 20s arms crossed Baseline: 25s arms crossed Goal status: INITIAL       LONG TERM GOALS: Target date: 10/25/2022   Increase FOTO score to 57 Baseline: 44 Goal status: INITIAL   2.   Increase BLE strength to 4+/5 throughout Baseline:  MMT Right eval Left eval  Hip flexion 4 4  Hip extension 4 4  Hip abduction 4 4  Hip adduction      Hip internal rotation      Hip external rotation      Knee flexion 4 4  Knee extension 4 4  Ankle dorsiflexion      Ankle plantarflexion 4 4    Goal status: INITIAL   3.  Increase lumbar ROM to 75% in all deficit areas Baseline:  AROM eval  Flexion 90%  Extension 25%  Right lateral flexion 50%  Left lateral flexion 50%  Right rotation 75%  Left rotation 75%    Goal status: INITIAL   4.  Increase core strength to 3+/5 Baseline: 3/5 Goal status: INITIAL       PLAN:   PT FREQUENCY: 2x/week   PT DURATION: 6 weeks   PLANNED INTERVENTIONS: Therapeutic exercises, Therapeutic activity, Neuromuscular re-education, Balance training, Gait training, Patient/Family education, Self Care, Joint mobilization, Aquatic Therapy, Manual therapy, and Re-evaluation.   PLAN FOR NEXT SESSION: HEP review and update, core strengthening, postural training, functional activities   Zahir Eisenhour C Radhika Dershem, PT 09/20/2022, 10:40 AM     

## 2022-09-22 NOTE — Therapy (Unsigned)
  OUTPATIENT PHYSICAL THERAPY TREATMENT NOTE   Patient Name: Dalton Alvarado MRN: [id number] DOB:03/18/1966, 57 y.o., male Today's Date: 09/22/2022  PCP: Marland Kitchen REFERRING PROVIDER: ***  END OF SESSION:    No past medical history on file. Past Surgical History:  Procedure Laterality Date   KNEE SURGERY     There are no problems to display for this patient.   REFERRING DIAG: ***  THERAPY DIAG:  No diagnosis found.  Rationale for Evaluation and Treatment {HABREHAB:[id number]}  PERTINENT HISTORY: ***  PRECAUTIONS: ***  SUBJECTIVE:                                                                                                                                                                                      SUBJECTIVE STATEMENT:  ***   PAIN:  Are you having pain? {OPRCPAIN:[id number]}   OBJECTIVE: (objective measures completed at initial evaluation unless otherwise dated)   (Copy Eval's Objective through Plan section here)   Lanice Shirts, PT 09/22/2022, 8:16 AM

## 2022-09-25 ENCOUNTER — Ambulatory Visit: Payer: 59 | Attending: Internal Medicine

## 2022-09-25 DIAGNOSIS — M5459 Other low back pain: Secondary | ICD-10-CM | POA: Insufficient documentation

## 2022-09-25 DIAGNOSIS — M6281 Muscle weakness (generalized): Secondary | ICD-10-CM | POA: Insufficient documentation

## 2022-09-25 DIAGNOSIS — M48061 Spinal stenosis, lumbar region without neurogenic claudication: Secondary | ICD-10-CM | POA: Insufficient documentation

## 2022-09-25 NOTE — Therapy (Deleted)
OUTPATIENT PHYSICAL THERAPY TREATMENT NOTE   Patient Name: Dalton Alvarado MRN: [id number] DOB:July 27, 1966, 57 y.o., male Today's Date: 09/20/2022  PCP: Sandi Mariscal, MD  REFERRING PROVIDER: Sandi Mariscal, MD   END OF SESSION:   PT End of Session - 09/20/22 0922     Visit Number 2    Number of Visits 12    Date for PT Re-Evaluation 11/08/22    Authorization Type [employer] MCR    PT Start Time [id number]   arrived late   PT Stop Time 1000    PT Time Calculation (min) 38 min    Activity Tolerance Patient limited by pain;Other (comment)   reluctant to participate in PT   Behavior During Therapy Flat affect             History reviewed. No pertinent past medical history. Past Surgical History:  Procedure Laterality Date   KNEE SURGERY     There are no problems to display for this patient.   REFERRING DIAG: M54.50 (ICD-10-CM) - Low back pain, unspecified   THERAPY DIAG:  Other low back pain  Muscle weakness (generalized)  Spinal stenosis of lumbar region without neurogenic claudication  Rationale for Evaluation and Treatment Rehabilitation  PERTINENT HISTORY: See chart   PRECAUTIONS: None   SUBJECTIVE:                                                                                                                                                                                      SUBJECTIVE STATEMENT:  Pt presents to PT with reports of continued lower back pain. Has been fairly compliant with HEP. Ready to begin PT at this time.    PAIN:  Are you having pain?  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 There is severe neuroforaminal  stenosis of L4 with compression of the L4 nerve roots.    PATIENT SURVEYS:  FOTO 44(57 predicted)   SCREENING FOR RED FLAGS: Bowel or bladder incontinence: No   COGNITION: Overall cognitive status: Within functional limits for tasks assessed                          SENSATION: Not tested   MUSCLE LENGTH: Hamstrings: Right 80 deg; Left 80 deg     POSTURE: No Significant postural limitations   PALPATION: Marked bony prominence at L4 spinous process  LUMBAR ROM:    AROM eval  Flexion 90%  Extension 25%  Right lateral flexion 50%  Left lateral flexion 50%  Right rotation 75%  Left rotation 75%   (Blank rows = not tested)   LOWER EXTREMITY ROM:   WFL   Active  Right eval Left eval  Hip flexion      Hip extension      Hip abduction      Hip adduction      Hip internal rotation      Hip external rotation      Knee flexion      Knee extension      Ankle dorsiflexion      Ankle plantarflexion      Ankle inversion      Ankle eversion       (Blank rows = not tested)   LOWER EXTREMITY MMT:     MMT Right eval Left eval  Hip flexion 4 4  Hip extension 4 4  Hip abduction 4 4  Hip adduction      Hip internal rotation      Hip external rotation      Knee flexion 4 4  Knee extension 4 4  Ankle dorsiflexion      Ankle plantarflexion 4 4  Ankle inversion      Ankle eversion       (Blank rows = not tested)   LUMBAR SPECIAL TESTS:  Straight leg raise test: Negative, Slump test: Negative, and FABER test: Negative   FUNCTIONAL TESTS:  5 times sit to stand: 25s   GAIT: Distance walked: 5fx2 Assistive device utilized: None Level of assistance: Complete Independence Comments: slow cadence   TREATMENT: [facility] Adult PT Treatment:                                                DATE: 09/20/2022 Therapeutic Exercise: NuStep lvl 5 UE/LE x 5 min while taking subjective Physioball rollout x 10 fwd Bridge 2x10 Modified thomas stretch x 60" each Supine clamshell  2x20 black TB Supine march 2x20 black TB Supine sciatic nerve glide x 15    PATIENT EDUCATION:  Education details: continue HEP Person educated: Patient Education method: Explanation Education comprehension: verbalized understanding and needs further education   HOME EXERCISE PROGRAM: Access Code: [email or web address] URL: [email or web address] Date: 09/20/2022 Prepared by: DOctavio Manns Exercises - Curl Up with Arms Crossed  - 2 x daily - 5 x weekly - 2 sets - 10 reps - Supine 90/90 Abdominal Bracing  - 2 x daily - 5 x weekly - 2 sets - 2 reps - 30s hold - Supine Bridge  - 2 x daily - 5 x weekly - 2 sets - 10 reps - Supine Double Knee to Chest  - 1 x daily - 7 x weekly - 2-3 reps - 30 sec hold - Modified Thomas Stretch  - 1 x daily - 7 x weekly - 2-3 reps - 60 sec hold - Supine Sciatic Nerve Glide  - 1 x daily - 7 x weekly - 2 sets - 10 reps   ASSESSMENT:   CLINICAL IMPRESSION: Pt was able to complete prescribed exercises with no adverse effect. Therapy focused on improving core and proximal hip strength as well as lumbar flexion stretching to reduce pain and improve comfort. HEP updated, will continue to progress as able  per POC.    OBJECTIVE IMPAIRMENTS: Abnormal gait, decreased activity tolerance, decreased endurance, decreased knowledge of condition, decreased mobility, decreased ROM, decreased strength, and pain.    ACTIVITY LIMITATIONS: carrying, lifting, bending, and standing   REHAB POTENTIAL: Fair based on chronicity and reluctance to undergo recommended surgical correction     GOALS: Goals reviewed with patient? No   SHORT TERM GOALS: Target date: 10/04/2022     Patient to demonstrate independence in HEP Baseline: [id number] Goal status: INITIAL   2.  Decrease 5x STS to 20s arms crossed Baseline: 25s arms crossed Goal status: INITIAL       LONG TERM GOALS: Target date: 10/25/2022   Increase FOTO score to 57 Baseline: 44 Goal status: INITIAL   2.   Increase BLE strength to 4+/5 throughout Baseline:  MMT Right eval Left eval  Hip flexion 4 4  Hip extension 4 4  Hip abduction 4 4  Hip adduction      Hip internal rotation      Hip external rotation      Knee flexion 4 4  Knee extension 4 4  Ankle dorsiflexion      Ankle plantarflexion 4 4    Goal status: INITIAL   3.  Increase lumbar ROM to 75% in all deficit areas Baseline:  AROM eval  Flexion 90%  Extension 25%  Right lateral flexion 50%  Left lateral flexion 50%  Right rotation 75%  Left rotation 75%    Goal status: INITIAL   4.  Increase core strength to 3+/5 Baseline: 3/5 Goal status: INITIAL       PLAN:   PT FREQUENCY: 2x/week   PT DURATION: 6 weeks   PLANNED INTERVENTIONS: Therapeutic exercises, Therapeutic activity, Neuromuscular re-education, Balance training, Gait training, Patient/Family education, Self Care, Joint mobilization, Aquatic Therapy, Manual therapy, and Re-evaluation.   PLAN FOR NEXT SESSION: HEP review and update, core strengthening, postural training, functional activities   Ward Chatters, PT 09/20/2022, 10:40 AM

## 2022-09-28 NOTE — Therapy (Deleted)
OUTPATIENT PHYSICAL THERAPY TREATMENT NOTE   Patient Name: Dalton Alvarado MRN: [id number] DOB:04/10/1966, 57 y.o., male Today's Date: 09/28/2022  PCP: Sandi Mariscal, MD  REFERRING PROVIDER: Sandi Mariscal, MD   END OF SESSION:     No past medical history on file. Past Surgical History:  Procedure Laterality Date   KNEE SURGERY     There are no problems to display for this patient.   REFERRING DIAG: M54.50 (ICD-10-CM) - Low back pain, unspecified   THERAPY DIAG:  No diagnosis found.  Rationale for Evaluation and Treatment Rehabilitation  PERTINENT HISTORY: See chart   PRECAUTIONS: None   SUBJECTIVE:                                                                                                                                                                                      SUBJECTIVE STATEMENT:  Pt presents to PT with reports of continued lower back pain. Has been fairly compliant with HEP. Ready to begin PT at this time.    PAIN:  Are you having pain?  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 There is severe neuroforaminal stenosis of L4 with compression of the L4 nerve roots.    PATIENT SURVEYS:  FOTO 44(57 predicted)   SCREENING FOR RED FLAGS: Bowel or bladder incontinence: No   COGNITION: Overall cognitive status: Within functional limits for tasks assessed                          SENSATION: Not tested   MUSCLE LENGTH: Hamstrings: Right 80 deg; Left 80 deg     POSTURE: No Significant postural limitations   PALPATION: Marked bony prominence at L4 spinous process   LUMBAR ROM:    AROM eval  Flexion 90%   Extension 25%  Right lateral flexion 50%  Left lateral flexion 50%  Right rotation 75%  Left rotation 75%   (Blank rows = not tested)   LOWER EXTREMITY ROM:   WFL   Active  Right eval Left eval  Hip flexion      Hip extension      Hip abduction      Hip adduction      Hip internal rotation      Hip external rotation      Knee flexion      Knee extension  Ankle dorsiflexion      Ankle plantarflexion      Ankle inversion      Ankle eversion       (Blank rows = not tested)   LOWER EXTREMITY MMT:     MMT Right eval Left eval  Hip flexion 4 4  Hip extension 4 4  Hip abduction 4 4  Hip adduction      Hip internal rotation      Hip external rotation      Knee flexion 4 4  Knee extension 4 4  Ankle dorsiflexion      Ankle plantarflexion 4 4  Ankle inversion      Ankle eversion       (Blank rows = not tested)   LUMBAR SPECIAL TESTS:  Straight leg raise test: Negative, Slump test: Negative, and FABER test: Negative   FUNCTIONAL TESTS:  5 times sit to stand: 25s   GAIT: Distance walked: 63fx2 Assistive device utilized: None Level of assistance: Complete Independence Comments: slow cadence   TREATMENT: [facility] Adult PT Treatment:                                                DATE: 09/20/2022 Therapeutic Exercise: NuStep lvl 5 UE/LE x 5 min while taking subjective Physioball rollout x 10 fwd Bridge 2x10 Modified thomas stretch x 60" each Supine clamshell 2x20 black TB Supine march 2x20 black TB Supine sciatic nerve glide x 15    PATIENT EDUCATION:  Education details: continue HEP Person educated: Patient Education method: Explanation Education comprehension: verbalized understanding and needs further education   HOME EXERCISE PROGRAM: Access Code: [email or web address] URL: [email or web address] Date: 09/20/2022 Prepared by: DOctavio Manns Exercises - Curl Up with Arms Crossed  - 2 x daily - 5 x weekly - 2 sets - 10 reps - Supine 90/90  Abdominal Bracing  - 2 x daily - 5 x weekly - 2 sets - 2 reps - 30s hold - Supine Bridge  - 2 x daily - 5 x weekly - 2 sets - 10 reps - Supine Double Knee to Chest  - 1 x daily - 7 x weekly - 2-3 reps - 30 sec hold - Modified Thomas Stretch  - 1 x daily - 7 x weekly - 2-3 reps - 60 sec hold - Supine Sciatic Nerve Glide  - 1 x daily - 7 x weekly - 2 sets - 10 reps   ASSESSMENT:   CLINICAL IMPRESSION: Pt was able to complete prescribed exercises with no adverse effect. Therapy focused on improving core and proximal hip strength as well as lumbar flexion stretching to reduce pain and improve comfort. HEP updated, will continue to progress as able per POC.    OBJECTIVE IMPAIRMENTS: Abnormal gait, decreased activity tolerance, decreased endurance, decreased knowledge of condition, decreased mobility, decreased ROM, decreased strength, and pain.    ACTIVITY LIMITATIONS: carrying, lifting, bending, and standing   REHAB POTENTIAL: Fair based on chronicity and reluctance to undergo recommended surgical correction     GOALS: Goals reviewed with patient? No   SHORT TERM GOALS: Target date: 10/04/2022     Patient to demonstrate independence in HEP Baseline: [id number] Goal status: INITIAL   2.  Decrease 5x STS to 20s arms crossed Baseline: 25s arms crossed Goal status: INITIAL       LONG TERM GOALS: Target date:  10/25/2022   Increase FOTO score to 57 Baseline: 44 Goal status: INITIAL   2.  Increase BLE strength to 4+/5 throughout Baseline:  MMT Right eval Left eval  Hip flexion 4 4  Hip extension 4 4  Hip abduction 4 4  Hip adduction      Hip internal rotation      Hip external rotation      Knee flexion 4 4  Knee extension 4 4  Ankle dorsiflexion      Ankle plantarflexion 4 4    Goal status: INITIAL   3.  Increase lumbar ROM to 75% in all deficit areas Baseline:  AROM eval  Flexion 90%  Extension 25%  Right lateral flexion 50%  Left lateral flexion 50%  Right rotation  75%  Left rotation 75%    Goal status: INITIAL   4.  Increase core strength to 3+/5 Baseline: 3/5 Goal status: INITIAL       PLAN:   PT FREQUENCY: 2x/week   PT DURATION: 6 weeks   PLANNED INTERVENTIONS: Therapeutic exercises, Therapeutic activity, Neuromuscular re-education, Balance training, Gait training, Patient/Family education, Self Care, Joint mobilization, Aquatic Therapy, Manual therapy, and Re-evaluation.   PLAN FOR NEXT SESSION: HEP review and update, core strengthening, postural training, functional activities   Lanice Shirts, PT 09/28/2022, 10:21 AM

## 2022-09-29 ENCOUNTER — Ambulatory Visit: Payer: 59

## 2022-10-02 ENCOUNTER — Telehealth: Payer: Self-pay | Admitting: Urology

## 2022-10-02 NOTE — Telephone Encounter (Signed)
DOS - 10/30/22  [facility] MPJ FUSION RIGHT --- [id number]  [employer] EFFECTIVE DATE - 08/21/22  PER [employer] WEBSITE FOR CPT CODE 57846 Notification or Prior Authorization is not required for the requested services   Decision ID #: [id number]

## 2022-10-04 ENCOUNTER — Ambulatory Visit: Payer: 59

## 2022-10-04 DIAGNOSIS — M6281 Muscle weakness (generalized): Secondary | ICD-10-CM

## 2022-10-04 DIAGNOSIS — M48061 Spinal stenosis, lumbar region without neurogenic claudication: Secondary | ICD-10-CM | POA: Diagnosis present

## 2022-10-04 DIAGNOSIS — M5459 Other low back pain: Secondary | ICD-10-CM

## 2022-10-04 NOTE — Therapy (Signed)
OUTPATIENT PHYSICAL THERAPY TREATMENT NOTE   Patient Name: Dalton Alvarado MRN: [id number] DOB:09-27-1965, 57 y.o., male Today's Date: 10/04/2022  PCP: Sandi Mariscal, MD  REFERRING PROVIDER: Sandi Mariscal, MD   END OF SESSION:   PT End of Session - 10/04/22 0925     Visit Number 3    Number of Visits 12    Date for PT Re-Evaluation 11/08/22    Authorization Type [employer] MCR    PT Start Time [id number]    PT Stop Time 1003    PT Time Calculation (min) 38 min    Activity Tolerance Patient limited by pain;Other (comment)   reluctant to participate in PT   Behavior During Therapy Flat affect              History reviewed. No pertinent past medical history. Past Surgical History:  Procedure Laterality Date   KNEE SURGERY     There are no problems to display for this patient.   REFERRING DIAG: M54.50 (ICD-10-CM) - Low back pain, unspecified   THERAPY DIAG:  Other low back pain  Muscle weakness (generalized)  Rationale for Evaluation and Treatment Rehabilitation  PERTINENT HISTORY: See chart   PRECAUTIONS: None   SUBJECTIVE:                                                                                                                                                                                      SUBJECTIVE STATEMENT:  Pt presents to PT with continued reports of lower back pain and discomfort. Reports numbness down R LE.     PAIN:  Are you having pain?  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 There is severe neuroforaminal stenosis of L4 with compression of the L4 nerve roots.    PATIENT SURVEYS:  FOTO 44(57 predicted)    SCREENING FOR RED FLAGS: Bowel or bladder incontinence: No   COGNITION: Overall cognitive status: Within functional limits for tasks assessed                          SENSATION: Not tested   MUSCLE LENGTH: Hamstrings: Right 80 deg; Left 80 deg     POSTURE: No Significant postural limitations   PALPATION: Marked bony prominence at L4 spinous process   LUMBAR ROM:    AROM eval  Flexion 90%  Extension 25%  Right  lateral flexion 50%  Left lateral flexion 50%  Right rotation 75%  Left rotation 75%   (Blank rows = not tested)   LOWER EXTREMITY ROM:   WFL   Active  Right eval Left eval  Hip flexion      Hip extension      Hip abduction      Hip adduction      Hip internal rotation      Hip external rotation      Knee flexion      Knee extension      Ankle dorsiflexion      Ankle plantarflexion      Ankle inversion      Ankle eversion       (Blank rows = not tested)   LOWER EXTREMITY MMT:     MMT Right eval Left eval  Hip flexion 4 4  Hip extension 4 4  Hip abduction 4 4  Hip adduction      Hip internal rotation      Hip external rotation      Knee flexion 4 4  Knee extension 4 4  Ankle dorsiflexion      Ankle plantarflexion 4 4  Ankle inversion      Ankle eversion       (Blank rows = not tested)   LUMBAR SPECIAL TESTS:  Straight leg raise test: Negative, Slump test: Negative, and FABER test: Negative   FUNCTIONAL TESTS:  5 times sit to stand: 25s   GAIT: Distance walked: 50fx2 Assistive device utilized: None Level of assistance: Complete Independence Comments: slow cadence   TREATMENT: [facility] Adult PT Treatment:                                                DATE: 10/04/2022 Therapeutic Exercise: Physioball rollout x 10 fwd Modified thomas stretch x 60" each Bridge 2x10 Supine clamshell 2x20 black TB Supine march 2x20 black TB Supine PPT x 10 - 5" hold STS 2x10 - no UE Seated sciatic nerve glide x 15  [facility] Adult PT Treatment:                                                 DATE: 09/20/2022 Therapeutic Exercise: NuStep lvl 5 UE/LE x 5 min while taking subjective Physioball rollout x 10 fwd Bridge 2x10 Modified thomas stretch x 60" each Supine clamshell 2x20 black TB Supine march 2x20 black TB Supine sciatic nerve glide x 15    PATIENT EDUCATION:  Education details: continue HEP Person educated: Patient Education method: Explanation Education comprehension: verbalized understanding and needs further education   HOME EXERCISE PROGRAM: Access Code: [email or web address] URL: [email or web address] Date: 09/20/2022 Prepared by: DOctavio Manns Exercises - Curl Up with Arms Crossed  - 2 x daily - 5 x weekly - 2 sets - 10 reps - Supine 90/90 Abdominal Bracing  - 2 x daily - 5 x weekly - 2 sets - 2 reps - 30s hold - Supine Bridge  - 2 x daily - 5 x weekly - 2 sets - 10 reps - Supine Double Knee to Chest  - 1 x daily - 7 x weekly - 2-3 reps - 30 sec hold - Modified [place] [employer] -  1 x daily - 7 x weekly - 2-3 reps - 60 sec hold - Supine Sciatic Nerve Glide  - 1 x daily - 7 x weekly - 2 sets - 10 reps   ASSESSMENT:   CLINICAL IMPRESSION: Pt was able to complete all prescribed exercises with continued discomfort in lower back. Pt again wanted to discuss options about possible lumbar fusion surgery. Therapy focused on improving core and proximal hip strength in order to decrease pain and improve comfort. Will continue to progress as tolerated per POC.    OBJECTIVE IMPAIRMENTS: Abnormal gait, decreased activity tolerance, decreased endurance, decreased knowledge of condition, decreased mobility, decreased ROM, decreased strength, and pain.    ACTIVITY LIMITATIONS: carrying, lifting, bending, and standing   REHAB POTENTIAL: Fair based on chronicity and reluctance to undergo recommended surgical correction     GOALS: Goals reviewed with patient? No   SHORT TERM GOALS: Target date: 10/04/2022     Patient to  demonstrate independence in HEP Baseline: [id number] Goal status: INITIAL   2.  Decrease 5x STS to 20s arms crossed Baseline: 25s arms crossed Goal status: INITIAL       LONG TERM GOALS: Target date: 10/25/2022   Increase FOTO score to 57 Baseline: 44 Goal status: INITIAL   2.  Increase BLE strength to 4+/5 throughout Baseline:  MMT Right eval Left eval  Hip flexion 4 4  Hip extension 4 4  Hip abduction 4 4  Hip adduction      Hip internal rotation      Hip external rotation      Knee flexion 4 4  Knee extension 4 4  Ankle dorsiflexion      Ankle plantarflexion 4 4    Goal status: INITIAL   3.  Increase lumbar ROM to 75% in all deficit areas Baseline:  AROM eval  Flexion 90%  Extension 25%  Right lateral flexion 50%  Left lateral flexion 50%  Right rotation 75%  Left rotation 75%    Goal status: INITIAL   4.  Increase core strength to 3+/5 Baseline: 3/5 Goal status: INITIAL       PLAN:   PT FREQUENCY: 2x/week   PT DURATION: 6 weeks   PLANNED INTERVENTIONS: Therapeutic exercises, Therapeutic activity, Neuromuscular re-education, Balance training, Gait training, Patient/Family education, Self Care, Joint mobilization, Aquatic Therapy, Manual therapy, and Re-evaluation.   PLAN FOR NEXT SESSION: HEP review and update, core strengthening, postural training, functional activities   Ward Chatters, PT 10/04/2022, 10:11 AM

## 2022-10-06 ENCOUNTER — Telehealth: Payer: Self-pay

## 2022-10-06 ENCOUNTER — Ambulatory Visit: Payer: 59

## 2022-10-06 NOTE — Telephone Encounter (Signed)
TC due to missed visit, number rang busy over several tries.  Attempted to inform patient of multiple no shows and attendance policy.

## 2022-10-10 NOTE — Therapy (Signed)
OUTPATIENT PHYSICAL THERAPY TREATMENT NOTE   Patient Name: Dalton Alvarado MRN: [id number] DOB:09-26-1965, 57 y.o., male Today's Date: 10/11/2022  PCP: Sandi Mariscal, MD  REFERRING PROVIDER: Sandi Mariscal, MD   END OF SESSION:   PT End of Session - 10/11/22 0927     Visit Number 4    Number of Visits 12    Date for PT Re-Evaluation 11/08/22    Authorization Type [employer] MCR    PT Start Time 0926   Pt arrived 11 mins late   PT Stop Time 1000    PT Time Calculation (min) 34 min             History reviewed. No pertinent past medical history. Past Surgical History:  Procedure Laterality Date   KNEE SURGERY     There are no problems to display for this patient.   REFERRING DIAG: M54.50 (ICD-10-CM) - Low back pain, unspecified   THERAPY DIAG:  Other low back pain  Muscle weakness (generalized)  Spinal stenosis of lumbar region without neurogenic claudication  Rationale for Evaluation and Treatment Rehabilitation  PERTINENT HISTORY: See chart   PRECAUTIONS: None   SUBJECTIVE:                                                                                                                                                                                      SUBJECTIVE STATEMENT:  Pt presents to PT with continued reports of lower back pain and numbness in RLE to toes.     PAIN:  Are you having pain?  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 There is severe neuroforaminal stenosis of L4 with compression of the L4 nerve roots.    PATIENT SURVEYS:  FOTO 44(57 predicted)   SCREENING FOR RED FLAGS: Bowel or bladder incontinence:  No   COGNITION: Overall cognitive status: Within functional limits for tasks assessed                          SENSATION: Not tested   MUSCLE LENGTH: Hamstrings: Right 80 deg; Left 80 deg     POSTURE: No Significant postural limitations   PALPATION: Marked bony prominence at L4 spinous process   LUMBAR ROM:    AROM eval  Flexion 90%  Extension 25%  Right lateral flexion 50%  Left lateral flexion 50%  Right rotation  75%  Left rotation 75%   (Blank rows = not tested)   LOWER EXTREMITY ROM:   WFL   Active  Right eval Left eval  Hip flexion      Hip extension      Hip abduction      Hip adduction      Hip internal rotation      Hip external rotation      Knee flexion      Knee extension      Ankle dorsiflexion      Ankle plantarflexion      Ankle inversion      Ankle eversion       (Blank rows = not tested)   LOWER EXTREMITY MMT:     MMT Right eval Left eval  Hip flexion 4 4  Hip extension 4 4  Hip abduction 4 4  Hip adduction      Hip internal rotation      Hip external rotation      Knee flexion 4 4  Knee extension 4 4  Ankle dorsiflexion      Ankle plantarflexion 4 4  Ankle inversion      Ankle eversion       (Blank rows = not tested)   LUMBAR SPECIAL TESTS:  Straight leg raise test: Negative, Slump test: Negative, and FABER test: Negative   FUNCTIONAL TESTS:  5 times sit to stand: 25s 10/11/22:16s   GAIT: Distance walked: 32fx2 Assistive device utilized: None Level of assistance: Complete Independence Comments: slow cadence   TREATMENT: [facility] Adult PT Treatment:                                                DATE: 10/11/22 Therapeutic Exercise: NuStep lvl 6 LE only x 3 min while taking subjective Physioball rollout x 10 fwd Modified thomas stretch x 60" each Bridge 2x10 Supine clamshell with PPT 2x10 black TB Supine PPT x 10 - 5" hold 5xSTS - 16s Supine sciatic nerve glide x 20 Modalities: IFC e-stim to lumbar paraspinals output  to 6, pt in prone, MHP applied x 10 mins post session    [facility]                                                [facility]: [facility] Therapeutic Exercise: Physioball rollout x 10 fwd Modified thomas stretch x 60" each Bridge 2x10 Supine clamshell 2x20 black TB Supine march 2x20 black TB Supine PPT x 10 - 5" hold STS 2x10 - no UE Seated sciatic nerve glide x 15  [facility] Adult PT Treatment:                                                DATE: 09/20/2022 Therapeutic Exercise: NuStep lvl 5 UE/LE x 5 min while taking subjective Physioball rollout x 10 fwd Bridge 2x10 Modified thomas stretch x 60" each Supine clamshell 2x20 black TB Supine march 2x20 black TB Supine sciatic nerve glide x 15    PATIENT EDUCATION:  Education details: continue HEP Person educated: Patient Education method: EElectronics engineer  comprehension: verbalized understanding and needs further education   HOME EXERCISE PROGRAM: Access Code: [email or web address] URL: [email or web address] Date: 09/20/2022 Prepared by: Octavio Manns  Exercises - Curl Up with Arms Crossed  - 2 x daily - 5 x weekly - 2 sets - 10 reps - Supine 90/90 Abdominal Bracing  - 2 x daily - 5 x weekly - 2 sets - 2 reps - 30s hold - Supine Bridge  - 2 x daily - 5 x weekly - 2 sets - 10 reps - Supine Double Knee to Chest  - 1 x daily - 7 x weekly - 2-3 reps - 30 sec hold - Modified Thomas Stretch  - 1 x daily - 7 x weekly - 2-3 reps - 60 sec hold - Supine Sciatic Nerve Glide  - 1 x daily - 7 x weekly - 2 sets - 10 reps   ASSESSMENT:   CLINICAL IMPRESSION: Patient arrives 11 minutes late to appointment, truncating session, and reporting continued lower back pain and numbness in RLE. He also reports HEP non-adherence. Re-administered 5xSTS this session with patient exceeding STG achieving 16 seconds with no UE support. Session today continued to focus on core and proximal hip strengthening. At end of session patient requested  e-stim and hot pack, applied to lumbar paraspinals with patient reporting therapeutic benefit. Patient continues to benefit from skilled PT services and should be progressed as able to improve functional independence.     OBJECTIVE IMPAIRMENTS: Abnormal gait, decreased activity tolerance, decreased endurance, decreased knowledge of condition, decreased mobility, decreased ROM, decreased strength, and pain.    ACTIVITY LIMITATIONS: carrying, lifting, bending, and standing   REHAB POTENTIAL: Fair based on chronicity and reluctance to undergo recommended surgical correction     GOALS: Goals reviewed with patient? No   SHORT TERM GOALS: Target date: 10/04/2022     Patient to demonstrate independence in HEP Baseline: [id number] Goal status: Ongoing Pt reports non-adherence 10/11/22   2.  Decrease 5x STS to 20s arms crossed Baseline: 25s arms crossed Goal status: MET 10/11/22: 16s       LONG TERM GOALS: Target date: 10/25/2022   Increase FOTO score to 57 Baseline: 44 Goal status: INITIAL   2.  Increase BLE strength to 4+/5 throughout Baseline:  MMT Right eval Left eval  Hip flexion 4 4  Hip extension 4 4  Hip abduction 4 4  Hip adduction      Hip internal rotation      Hip external rotation      Knee flexion 4 4  Knee extension 4 4  Ankle dorsiflexion      Ankle plantarflexion 4 4    Goal status: INITIAL   3.  Increase lumbar ROM to 75% in all deficit areas Baseline:  AROM eval  Flexion 90%  Extension 25%  Right lateral flexion 50%  Left lateral flexion 50%  Right rotation 75%  Left rotation 75%    Goal status: INITIAL   4.  Increase core strength to 3+/5 Baseline: 3/5 Goal status: INITIAL       PLAN:   PT FREQUENCY: 2x/week   PT DURATION: 6 weeks   PLANNED INTERVENTIONS: Therapeutic exercises, Therapeutic activity, Neuromuscular re-education, Balance training, Gait training, Patient/Family education, Self Care, Joint mobilization, Aquatic Therapy,  Manual therapy, and Re-evaluation.   PLAN FOR NEXT SESSION: HEP review and update, core strengthening, postural training, functional activities   Margarette Canada, PTA 10/11/2022, 9:28 AM

## 2022-10-11 ENCOUNTER — Ambulatory Visit: Payer: 59

## 2022-10-11 DIAGNOSIS — M48061 Spinal stenosis, lumbar region without neurogenic claudication: Secondary | ICD-10-CM

## 2022-10-11 DIAGNOSIS — M6281 Muscle weakness (generalized): Secondary | ICD-10-CM

## 2022-10-11 DIAGNOSIS — M5459 Other low back pain: Secondary | ICD-10-CM

## 2022-10-13 ENCOUNTER — Ambulatory Visit: Payer: 59

## 2022-10-13 ENCOUNTER — Telehealth: Payer: Self-pay

## 2022-10-13 NOTE — Telephone Encounter (Signed)
TC due to missed visit, number rang busy over several attempts, unable to leave VM.

## 2022-10-17 NOTE — Therapy (Signed)
OUTPATIENT PHYSICAL THERAPY TREATMENT NOTE   Patient Name: Dalton Alvarado MRN: [id number] DOB:21-Jul-1966, 57 y.o., male Today's Date: 10/18/2022  PCP: Sandi Mariscal, MD  REFERRING PROVIDER: Sandi Mariscal, MD   END OF SESSION:   PT End of Session - 10/18/22 0917     Visit Number 5    Number of Visits 12    Date for PT Re-Evaluation 11/08/22    Authorization Type [employer] MCR    PT Start Time 0917    PT Stop Time 0957    PT Time Calculation (min) 40 min    Activity Tolerance Patient limited by pain    Behavior During Therapy Flat affect;WFL for tasks assessed/performed              History reviewed. No pertinent past medical history. Past Surgical History:  Procedure Laterality Date   KNEE SURGERY     There are no problems to display for this patient.   REFERRING DIAG: M54.50 (ICD-10-CM) - Low back pain, unspecified   THERAPY DIAG:  Other low back pain  Muscle weakness (generalized)  Spinal stenosis of lumbar region without neurogenic claudication  Rationale for Evaluation and Treatment Rehabilitation  PERTINENT HISTORY: See chart   PRECAUTIONS: None   SUBJECTIVE:                                                                                                                                                                                      SUBJECTIVE STATEMENT:  Pt presents to PT with continued reports of lower back pain and numbness in RLE to toes.  Reports occasional HEP compliance.    PAIN:  Are you having pain?  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 There is severe neuroforaminal stenosis of L4 with compression  of the L4 nerve roots.    PATIENT SURVEYS:  FOTO 44(57 predicted)   SCREENING FOR RED FLAGS: Bowel or bladder incontinence: No   COGNITION: Overall cognitive status: Within functional limits for tasks assessed                          SENSATION: Not tested   MUSCLE LENGTH: Hamstrings: Right 80 deg; Left 80 deg     POSTURE: No Significant postural limitations   PALPATION: Marked bony prominence at L4 spinous process   LUMBAR ROM:    AROM eval  Flexion 90%  Extension 25%  Right lateral flexion 50%  Left lateral flexion 50%  Right rotation 75%  Left rotation 75%   (Blank rows = not tested)   LOWER EXTREMITY ROM:   WFL   Active  Right eval Left eval  Hip flexion      Hip extension      Hip abduction      Hip adduction      Hip internal rotation      Hip external rotation      Knee flexion      Knee extension      Ankle dorsiflexion      Ankle plantarflexion      Ankle inversion      Ankle eversion       (Blank rows = not tested)   LOWER EXTREMITY MMT:     MMT Right eval Left eval  Hip flexion 4 4  Hip extension 4 4  Hip abduction 4 4  Hip adduction      Hip internal rotation      Hip external rotation      Knee flexion 4 4  Knee extension 4 4  Ankle dorsiflexion      Ankle plantarflexion 4 4  Ankle inversion      Ankle eversion       (Blank rows = not tested)   LUMBAR SPECIAL TESTS:  Straight leg raise test: Negative, Slump test: Negative, and FABER test: Negative   FUNCTIONAL TESTS:  5 times sit to stand: 25s 10/11/22:16s   GAIT: Distance walked: 35fx2 Assistive device utilized: None Level of assistance: Complete Independence Comments: slow cadence   TREATMENT: [facility] Adult PT Treatment:                                                DATE: 10/18/22 Therapeutic Exercise: NuStep lvl 6 LE only x 5 min while taking subjective Sidestepping RTB at ankles x3 laps Standing hip extension RTB at ankles 2x10 BIL Omega knee extension 20# 2x10,  25# x10 Omega knee flexion 45# 3x10 Physioball rollout x 10 fwd LTR x10 BIL STS x10 no UE Bridge 2x10 Supine PPT x 10 - 5" hold Supine sciatic nerve glide x 20 Figure 4 piriformis stretch 2x30" BIL Modalities: IFC e-stim to lumbar paraspinals output to 6, pt in prone, MHP applied x 10 mins post session  [facility]                                                [facility]: 10/11[facility]22 Therapeutic Exercise: NuStep lvl 6 LE only x 3 min while taking subjective Physioball rollout x 10 fwd Modified thomas stretch x 60" each Bridge 2x10 Supine clamshell with PPT 2x10 black TB Supine PPT x 10 - 5" hold 5xSTS - 16s Supine sciatic nerve glide x 20 Modalities: IFC e-stim to lumbar paraspinals output to 6, pt in prone, MHP applied x 10 mins post session    [facility]  [facility]: [facility] Therapeutic Exercise: Physioball rollout x 10 fwd Modified thomas stretch x 60" each Bridge 2x10 Supine clamshell 2x20 black TB Supine march 2x20 black TB Supine PPT x 10 - 5" hold STS 2x10 - no UE Seated sciatic nerve glide x 15    PATIENT EDUCATION:  Education details: continue HEP Person educated: Patient Education method: Explanation Education comprehension: verbalized understanding and needs further education   HOME EXERCISE PROGRAM: Access Code: [email or web address] URL: [email or web address] Date: 09/20/2022 Prepared by: Octavio Manns  Exercises - Curl Up with Arms Crossed  - 2 x daily - 5 x weekly - 2 sets - 10 reps - Supine 90/90 Abdominal Bracing  - 2 x daily - 5 x weekly - 2 sets - 2 reps - 30s hold - Supine Bridge  - 2 x daily - 5 x weekly - 2 sets - 10 reps - Supine Double Knee to Chest  - 1 x daily - 7 x weekly - 2-3 reps - 30 sec hold - Modified Thomas Stretch  - 1 x daily - 7 x weekly - 2-3 reps - 60 sec hold - Supine Sciatic Nerve Glide  - 1 x daily - 7 x weekly - 2 sets - 10 reps   ASSESSMENT:   CLINICAL  IMPRESSION: Patient arrived to PT reporting continued LBP and N/T down RLE to the toes. He reports sporadic HEP compliance at this time. Session today continued to focus on core and proximal hip strengthening. He is somewhat limited by pain throughout session. Patient continues to benefit from skilled PT services and should be progressed as able to improve functional independence.     OBJECTIVE IMPAIRMENTS: Abnormal gait, decreased activity tolerance, decreased endurance, decreased knowledge of condition, decreased mobility, decreased ROM, decreased strength, and pain.    ACTIVITY LIMITATIONS: carrying, lifting, bending, and standing   REHAB POTENTIAL: Fair based on chronicity and reluctance to undergo recommended surgical correction     GOALS: Goals reviewed with patient? No   SHORT TERM GOALS: Target date: 10/04/2022     Patient to demonstrate independence in HEP Baseline: [email or web address] Goal status: Ongoing Pt reports non-adherence 10/11/22   2.  Decrease 5x STS to 20s arms crossed Baseline: 25s arms crossed Goal status: MET 10/11/22: 16s       LONG TERM GOALS: Target date: 10/25/2022   Increase FOTO score to 57 Baseline: 44 Goal status: INITIAL   2.  Increase BLE strength to 4+/5 throughout Baseline:  MMT Right eval Left eval  Hip flexion 4 4  Hip extension 4 4  Hip abduction 4 4  Hip adduction      Hip internal rotation      Hip external rotation      Knee flexion 4 4  Knee extension 4 4  Ankle dorsiflexion      Ankle plantarflexion 4 4    Goal status: INITIAL   3.  Increase lumbar ROM to 75% in all deficit areas Baseline:  AROM eval  Flexion 90%  Extension 25%  Right lateral flexion 50%  Left lateral flexion 50%  Right rotation 75%  Left rotation 75%    Goal status: INITIAL   4.  Increase core strength to 3+/5 Baseline: 3/5 Goal status: INITIAL       PLAN:   PT FREQUENCY: 2x/week   PT DURATION: 6 weeks   PLANNED INTERVENTIONS: Therapeutic  exercises, Therapeutic activity, Neuromuscular re-education, Balance training, Gait training, Patient/Family education, Self Care, Joint mobilization, Aquatic Therapy, Manual therapy, and Re-evaluation.  PLAN FOR NEXT SESSION: HEP review and update, core strengthening, postural training, functional activities   Margarette Canada, PTA 10/18/2022, 9:18 AM

## 2022-10-18 ENCOUNTER — Ambulatory Visit: Payer: 59

## 2022-10-18 DIAGNOSIS — M5459 Other low back pain: Secondary | ICD-10-CM

## 2022-10-18 DIAGNOSIS — M6281 Muscle weakness (generalized): Secondary | ICD-10-CM

## 2022-10-18 DIAGNOSIS — M48061 Spinal stenosis, lumbar region without neurogenic claudication: Secondary | ICD-10-CM

## 2022-10-20 ENCOUNTER — Ambulatory Visit: Payer: 59 | Attending: Internal Medicine

## 2022-10-20 DIAGNOSIS — M5459 Other low back pain: Secondary | ICD-10-CM | POA: Diagnosis present

## 2022-10-20 DIAGNOSIS — M6281 Muscle weakness (generalized): Secondary | ICD-10-CM

## 2022-10-20 DIAGNOSIS — M48061 Spinal stenosis, lumbar region without neurogenic claudication: Secondary | ICD-10-CM | POA: Diagnosis present

## 2022-10-20 NOTE — Therapy (Addendum)
OUTPATIENT PHYSICAL THERAPY TREATMENT NOTE/DC SUMMARY   Patient Name: Dalton Alvarado MRN: [id number] DOB:10/06/1965, 57 y.o., male Today's Date: 10/20/2022  PCP: Salli Real, MD  REFERRING PROVIDER: Salli Real, MD  PHYSICAL THERAPY DISCHARGE SUMMARY  Visits from Start of Care: 6  Current functional level related to goals / functional outcomes: UTA   Remaining deficits: UTA   Education / Equipment: HEP   Patient agrees to discharge. Patient goals were partially met. Patient is being discharged due to not returning since the last visit.  END OF SESSION:   PT End of Session - 10/20/22 0916     Visit Number 6    Number of Visits 12    Date for PT Re-Evaluation 11/08/22    Authorization Type [employer] MCR    PT Start Time 0915    PT Stop Time 0955    PT Time Calculation (min) 40 min    Activity Tolerance Patient limited by pain;Patient tolerated treatment well    Behavior During Therapy Flat affect;WFL for tasks assessed/performed              History reviewed. No pertinent past medical history. Past Surgical History:  Procedure Laterality Date   KNEE SURGERY     There are no problems to display for this patient.   REFERRING DIAG: M54.50 (ICD-10-CM) - Low back pain, unspecified   THERAPY DIAG:  Other low back pain  Muscle weakness (generalized)  Spinal stenosis of lumbar region without neurogenic claudication  Rationale for Evaluation and Treatment Rehabilitation  PERTINENT HISTORY: See chart   PRECAUTIONS: None   SUBJECTIVE:                                                                                                                                                                                      SUBJECTIVE STATEMENT:  Reporting no change overall, has been experiencing N&T into both feet.  Continues to perseverate over spinal surgery, FOTO score unchanged   PAIN:  Are you having pain?  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 There is severe neuroforaminal stenosis of L4 with compression of the L4 nerve roots.    PATIENT SURVEYS:  FOTO 44(57 predicted); 10/20/22 44   SCREENING FOR RED FLAGS: Bowel or bladder incontinence: No   COGNITION: Overall cognitive status: Within functional limits for tasks assessed  SENSATION: Not tested   MUSCLE LENGTH: Hamstrings: Right 80 deg; Left 80 deg     POSTURE: No Significant postural limitations   PALPATION: Marked bony prominence at L4 spinous process   LUMBAR ROM:    AROM eval  Flexion 90%  Extension 25%  Right lateral flexion 50%  Left lateral flexion 50%  Right rotation 75%  Left rotation 75%   (Blank rows = not tested)   LOWER EXTREMITY ROM:   WFL   Active  Right eval Left eval  Hip flexion      Hip extension      Hip abduction      Hip adduction      Hip internal rotation      Hip external rotation      Knee flexion      Knee extension      Ankle dorsiflexion      Ankle plantarflexion      Ankle inversion      Ankle eversion       (Blank rows = not tested)   LOWER EXTREMITY MMT:     MMT Right eval Left eval  Hip flexion 4 4  Hip extension 4 4  Hip abduction 4 4  Hip adduction      Hip internal rotation      Hip external rotation      Knee flexion 4 4  Knee extension 4 4  Ankle dorsiflexion      Ankle plantarflexion 4 4  Ankle inversion      Ankle eversion       (Blank rows = not tested)   LUMBAR SPECIAL TESTS:  Straight leg raise test: Negative, Slump test: Negative, and FABER test: Negative   FUNCTIONAL TESTS:  5 times sit to stand: 25s 10/11/22:16s   GAIT: Distance  walked: 24ftx2 Assistive device utilized: None Level of assistance: Complete Independence Comments: slow cadence   TREATMENT: [facility] Adult PT Treatment:                                                DATE: 10/20/22 Therapeutic Exercise: Nustep L4 8 min QL stretch  90/90 30s x2 Planks on toes(unable to hold for 30s on toes) Plank from knees 30s x2 Hip flexor stretch 30s x2 Side planks 30s x2 Modalities: IFC e-stim to lumbar paraspinals output to 7, pt in prone, MHP applied x 12 mins post session  [facility] Adult PT Treatment:                                                DATE: 10/18/22 Therapeutic Exercise: NuStep lvl 6 LE only x 5 min while taking subjective Sidestepping RTB at ankles x3 laps Standing hip extension RTB at ankles 2x10 BIL Omega knee extension 20# 2x10, 25# x10 Omega knee flexion 45# 3x10 Physioball rollout x 10 fwd LTR x10 BIL STS x10 no UE Bridge 2x10 Supine PPT x 10 - 5" hold Supine sciatic nerve glide x 20 Figure 4 piriformis stretch 2x30" BIL Modalities: IFC e-stim to lumbar paraspinals output to 6, pt in prone, MHP applied x 10 mins post session  [facility] Adult PT Treatment:  DATE: 10/11/22 Therapeutic Exercise: NuStep lvl 6 LE only x 3 min while taking subjective Physioball rollout x 10 fwd Modified thomas stretch x 60" each Bridge 2x10 Supine clamshell with PPT 2x10 black TB Supine PPT x 10 - 5" hold 5xSTS - 16s Supine sciatic nerve glide x 20 Modalities: IFC e-stim to lumbar paraspinals output to 6, pt in prone, MHP applied x 10 mins post session    [facility] Adult PT Treatment:                                                DATE: 10/04/2022 Therapeutic Exercise: Physioball rollout x 10 fwd Modified thomas stretch x 60" each Bridge 2x10 Supine clamshell 2x20 black TB Supine march 2x20 black TB Supine PPT x 10 - 5" hold STS 2x10 - no UE Seated sciatic nerve glide x 15    PATIENT EDUCATION:  Education  details: continue HEP Person educated: Patient Education method: Explanation Education comprehension: verbalized understanding and needs further education   HOME EXERCISE PROGRAM: Access Code: [email or web address] URL: [email or web address] Date: 09/20/2022 Prepared by: Edwinna Areola  Exercises - Curl Up with Arms Crossed  - 2 x daily - 5 x weekly - 2 sets - 10 reps - Supine 90/90 Abdominal Bracing  - 2 x daily - 5 x weekly - 2 sets - 2 reps - 30s hold - Supine Bridge  - 2 x daily - 5 x weekly - 2 sets - 10 reps - Supine Double Knee to Chest  - 1 x daily - 7 x weekly - 2-3 reps - 30 sec hold - Modified Thomas Stretch  - 1 x daily - 7 x weekly - 2-3 reps - 60 sec hold - Supine Sciatic Nerve Glide  - 1 x daily - 7 x weekly - 2 sets - 10 reps   ASSESSMENT:   CLINICAL IMPRESSION: No distinct change in symptoms to note, FOTO score unchanged.  Today was focused on stretching and core strengthening with weakness an endurance deficits noted in core as patient unable to maintain plank for desired time.  Overall flexibility was [facility], no marked tightness noted.  Patient continues to focus on possible lumbar surgery and is scheduled for f/u with spine surgeon this month     OBJECTIVE IMPAIRMENTS: Abnormal gait, decreased activity tolerance, decreased endurance, decreased knowledge of condition, decreased mobility, decreased ROM, decreased strength, and pain.    ACTIVITY LIMITATIONS: carrying, lifting, bending, and standing   REHAB POTENTIAL: Fair based on chronicity and reluctance to undergo recommended surgical correction     GOALS: Goals reviewed with patient? No   SHORT TERM GOALS: Target date: 10/04/2022     Patient to demonstrate independence in HEP Baseline: [email or web address] Goal status: Ongoing Pt reports non-adherence 10/11/22   2.  Decrease 5x STS to 20s arms crossed Baseline: 25s arms crossed Goal status: MET 10/11/22: 16s       LONG TERM GOALS: Target date: 10/25/2022   Increase  FOTO score to 57 Baseline: 44 Goal status: INITIAL   2.  Increase BLE strength to 4+/5 throughout Baseline:  MMT Right eval Left eval  Hip flexion 4 4  Hip extension 4 4  Hip abduction 4 4  Hip adduction      Hip internal rotation      Hip external rotation      Knee flexion 4 4  Knee extension 4 4  Ankle dorsiflexion      Ankle plantarflexion 4 4    Goal status: INITIAL   3.  Increase lumbar ROM to 75% in all deficit areas Baseline:  AROM eval  Flexion 90%  Extension 25%  Right lateral flexion 50%  Left lateral flexion 50%  Right rotation 75%  Left rotation 75%    Goal status: INITIAL   4.  Increase core strength to 3+/5 Baseline: 3/5 Goal status: INITIAL       PLAN:   PT FREQUENCY: 2x/week   PT DURATION: 6 weeks   PLANNED INTERVENTIONS: Therapeutic exercises, Therapeutic activity, Neuromuscular re-education, Balance training, Gait training, Patient/Family education, Self Care, Joint mobilization, Aquatic Therapy, Manual therapy, and Re-evaluation.   PLAN FOR NEXT SESSION: HEP review and update, core strengthening, postural training, functional activities   Hildred Laser, PT 10/20/2022, 10:09 AM

## 2022-10-25 ENCOUNTER — Ambulatory Visit: Payer: 59 | Admitting: Physical Therapy

## 2022-10-26 ENCOUNTER — Telehealth: Payer: Self-pay

## 2022-10-26 NOTE — Telephone Encounter (Signed)
Dalton Alvarado is scheduled for surgery on 10/30/2022 with Dr. Posey Pronto. I left a message on 10/13/2022, 10/23/2022 and 10/25/2022 with no response.I have no H&P and he has not been scheduled for pre op. I spoke to Dr. Posey Pronto and he stated to cancel his surgery and do not reschedule.

## 2022-10-30 ENCOUNTER — Encounter (HOSPITAL_BASED_OUTPATIENT_CLINIC_OR_DEPARTMENT_OTHER): Admission: RE | Payer: Self-pay | Source: Ambulatory Visit

## 2022-10-30 ENCOUNTER — Ambulatory Visit (HOSPITAL_BASED_OUTPATIENT_CLINIC_OR_DEPARTMENT_OTHER): Admission: RE | Admit: 2022-10-30 | Payer: 59 | Source: Ambulatory Visit | Admitting: Podiatry

## 2022-10-30 SURGERY — FUSION, JOINT, GREAT TOE
Anesthesia: Choice | Site: Toe | Laterality: Right

## 2022-11-07 ENCOUNTER — Encounter: Payer: Medicare Other | Admitting: Podiatry

## 2022-11-21 ENCOUNTER — Ambulatory Visit: Payer: Medicare Other

## 2023-07-09 ENCOUNTER — Telehealth: Payer: Self-pay

## 2023-07-09 ENCOUNTER — Other Ambulatory Visit (HOSPITAL_COMMUNITY): Payer: Self-pay

## 2023-07-09 NOTE — Telephone Encounter (Signed)
[facility] Patient Advocate Encounter  Insurance verification completed.    The patient is insured through [employer].  Medication will need a PA.  We will continue to follow to see if copay assistance is needed.  Alexius Ellington, CPhT [facility] Phone: [phone] Fax:  [phone]  

## 2023-07-18 ENCOUNTER — Encounter: Payer: 59 | Admitting: Internal Medicine

## 2023-07-25 ENCOUNTER — Encounter: Payer: 59 | Admitting: Family

## 2023-07-26 ENCOUNTER — Telehealth: Payer: Self-pay

## 2023-07-26 NOTE — Telephone Encounter (Signed)
Patient left a VM to reschedule 07/25/2023 appt that was scheduled with Marcos Eke at 2pm - New Hep C appt. Attempted to call back for rescheduling, but rang w/ no answer and stated call cannot be completed.   If pt is to call back his contact information needs to be verified.

## 2023-08-10 ENCOUNTER — Ambulatory Visit: Payer: 59 | Admitting: Infectious Diseases

## 2023-08-27 ENCOUNTER — Ambulatory Visit: Payer: 59 | Admitting: Infectious Diseases

## 2024-05-06 ENCOUNTER — Ambulatory Visit
Admission: EM | Admit: 2024-05-06 | Discharge: 2024-05-06 | Disposition: A | Discharge status: Home / Self Care | Attending: Family Medicine | Admitting: Family Medicine

## 2024-05-06 ENCOUNTER — Encounter: Payer: Self-pay | Admitting: Emergency Medicine

## 2024-05-06 ENCOUNTER — Other Ambulatory Visit: Payer: Self-pay

## 2024-05-06 DIAGNOSIS — R310 Gross hematuria: Secondary | ICD-10-CM | POA: Insufficient documentation

## 2024-05-06 LAB — POCT URINE DIPSTICK
Glucose, UA: NEGATIVE mg/dL
Leukocytes, UA: NEGATIVE
Nitrite, UA: POSITIVE — AB
POC PROTEIN,UA: >=300 — AB
Spec Grav, UA: >=1.03 — AB (ref 1.010–1.025)
Urobilinogen, UA: 1 U/dL
pH, UA: 5.5 (ref 5.0–8.0)

## 2024-05-06 NOTE — ED Provider Notes (Signed)
 [facility]    CSN: [id number] Arrival date & time: 05/06/24  0808      History   Chief Complaint Chief Complaint  Patient presents with   Hematuria    HPI Dalton Alvarado is a 58 y.o. male.    Hematuria  Patient is here for blood in his urine.  He noted blood in his urine several weeks ago.  This happened several times, but thought was due to having intercourse.  The urine was looking normal/better.  This morning he woke up again and noted blood in his urine again.  It was a lot, and then he noted a clot in the urine today.  No abd pain, no back pain.  No urinary frequency.   No fevers/chills.  He smokes cigars ocassionally.  No cigarette use.       History reviewed. No pertinent past medical history.  There are no active problems to display for this patient.   Past Surgical History:  Procedure Laterality Date   KNEE SURGERY         Home Medications    Prior to Admission medications   Medication Sig Start Date End Date Taking? Authorizing Provider  Cholecalciferol (VITAMIN D3) 1.25 MG (50000 UT) CAPS Take 1 capsule by mouth once a week. 02/05/20   [provider]  cyclobenzaprine  (FLEXERIL ) 10 MG tablet Take 1 tablet (10 mg total) by mouth 3 (three) times daily as needed for muscle spasms. 02/14/20   Loetta Senior, MD  EPCLUSA 400-100 MG TABS Take 1 tablet by mouth daily. 12/29/19   [provider]  sildenafil (VIAGRA) 50 MG tablet Take 50 mg by mouth as needed for erectile dysfunction. 12/17/19   [provider]  terbinafine  (LAMISIL ) 250 MG tablet Take 1 tablet (250 mg total) by mouth daily. 08/11/22   Tobie Franky SQUIBB, DPM    Family History History reviewed. No pertinent family history.  Social History Social History   Tobacco Use   Smoking status: Never   Smokeless tobacco: Never  Substance Use Topics   Alcohol  use: Yes   Drug use: Not Currently     Allergies   Patient has no known allergies.   Review  of Systems Review of Systems  Constitutional: Negative.   HENT: Negative.    Respiratory: Negative.    Cardiovascular: Negative.   Gastrointestinal: Negative.   Genitourinary:  Positive for hematuria.  Musculoskeletal: Negative.   Psychiatric/Behavioral: Negative.       Physical Exam Triage Vital Signs ED Triage Vitals [05/06/24 0848]  Encounter Vitals Group     BP (!) 147/95     Girls Systolic BP Percentile      Girls Diastolic BP Percentile      Boys Systolic BP Percentile      Boys Diastolic BP Percentile      Pulse Rate 84     Resp 18     Temp 98.2 F (36.8 C)     Temp Source Oral     SpO2 96 %     Weight      Height      Head Circumference      Peak Flow      Pain Score 0     Pain Loc      Pain Education      Exclude from Growth Chart    No data found.  Updated Vital Signs BP (!) 147/95 (BP Location: Left Arm)   Pulse 84   Temp 98.2 F (36.8 C) (  Oral)   Resp 18   SpO2 96%   Visual Acuity Right Eye Distance:   Left Eye Distance:   Bilateral Distance:    Right Eye Near:   Left Eye Near:    Bilateral Near:     Physical Exam Constitutional:      General: He is not in acute distress.    Appearance: Normal appearance. He is normal weight. He is not ill-appearing or toxic-appearing.  HENT:     Mouth/Throat:     Mouth: Mucous membranes are moist.  Cardiovascular:     Rate and Rhythm: Normal rate and regular rhythm.  Pulmonary:     Effort: Pulmonary effort is normal.     Breath sounds: Normal breath sounds.  Abdominal:     Palpations: Abdomen is soft.     Tenderness: There is no abdominal tenderness. There is no right CVA tenderness, left CVA tenderness, guarding or rebound.  Skin:    General: Skin is warm.  Neurological:     General: No focal deficit present.     Mental Status: He is alert.  Psychiatric:        Mood and Affect: Mood normal.      UC Treatments / Results  Labs (all labs ordered are listed, but only abnormal results are  displayed) Labs Reviewed  POCT URINE DIPSTICK - Abnormal; Notable for the following components:      Result Value   Color, UA red (*)    Clarity, UA cloudy (*)    Bilirubin, UA small (*)    Ketones, POC UA trace (5) (*)    Spec Grav, UA >=1.030 (*)    Blood, UA large (*)    POC PROTEIN,UA >=300 (*)    Nitrite, UA Positive (*)    All other components within normal limits    EKG   Radiology No results found.  Procedures Procedures (including critical care time)  Medications Ordered in UC Medications - No data to display  Initial Impression / Assessment and Plan / UC Course  I have reviewed the triage vital signs and the nursing notes.  Pertinent labs & imaging results that were available during my care of the patient were reviewed by me and considered in my medical decision making (see chart for details).   Final Clinical Impressions(s) / UC Diagnoses   Final diagnoses:  Gross hematuria     Discharge Instructions      You were seen today for blood in your urine.  Your urine does not show acute infection, but I will send to the lab for further testing.  I have referred you to [facility] at [address] [facility];  Phone number is [phone].  You have an appointment scheduled for 05/07/24 at 9am.  You should also touch base with your primary care provider for further care/discussion as well.     ED Prescriptions   None    PDMP not reviewed this encounter.   Darral Longs, MD 05/06/24 [phone]

## 2024-05-06 NOTE — Discharge Instructions (Addendum)
 You were seen today for blood in your urine.  Your urine does not show acute infection, but I will send to the lab for further testing.  I have referred you to [facility] at [address] [facility];  Phone number is [phone].  You have an appointment scheduled for 05/07/24 at 9am.  You should also touch base with your primary care provider for further care/discussion as well.

## 2024-05-06 NOTE — ED Triage Notes (Signed)
 Pt here after noting blood in urine when voiding this am; pt denies pain; pt sts recent cold sx with steroid injection and some injection in his back last week

## 2024-05-07 LAB — URINE CULTURE: Culture: NO GROWTH

## 2024-05-08 ENCOUNTER — Emergency Department (HOSPITAL_BASED_OUTPATIENT_CLINIC_OR_DEPARTMENT_OTHER)
Admission: EM | Admit: 2024-05-08 | Discharge: 2024-05-08 | Disposition: A | Discharge status: Home / Self Care | Attending: Emergency Medicine | Admitting: Emergency Medicine

## 2024-05-08 ENCOUNTER — Emergency Department (HOSPITAL_BASED_OUTPATIENT_CLINIC_OR_DEPARTMENT_OTHER)

## 2024-05-08 ENCOUNTER — Other Ambulatory Visit: Payer: Self-pay

## 2024-05-08 ENCOUNTER — Encounter (HOSPITAL_BASED_OUTPATIENT_CLINIC_OR_DEPARTMENT_OTHER): Payer: Self-pay | Admitting: Radiology

## 2024-05-08 DIAGNOSIS — Z79899 Other long term (current) drug therapy: Secondary | ICD-10-CM | POA: Insufficient documentation

## 2024-05-08 DIAGNOSIS — M545 Low back pain, unspecified: Secondary | ICD-10-CM | POA: Insufficient documentation

## 2024-05-08 DIAGNOSIS — R319 Hematuria, unspecified: Secondary | ICD-10-CM | POA: Diagnosis present

## 2024-05-08 LAB — URINALYSIS, ROUTINE W REFLEX MICROSCOPIC
Bacteria, UA: NONE SEEN
Bilirubin Urine: NEGATIVE
Glucose, UA: NEGATIVE mg/dL
Ketones, ur: NEGATIVE mg/dL
Leukocytes,Ua: NEGATIVE
Nitrite: NEGATIVE
Protein, ur: NEGATIVE mg/dL
RBC / HPF: >50 RBC/hpf (ref 0–5)
Specific Gravity, Urine: 1.023 (ref 1.005–1.030)
pH: 5.5 (ref 5.0–8.0)

## 2024-05-08 LAB — BASIC METABOLIC PANEL WITH GFR
Anion gap: 13 (ref 5–15)
BUN: 14 mg/dL (ref 6–20)
CO2: 23 mmol/L (ref 22–32)
Calcium: 9.3 mg/dL (ref 8.9–10.3)
Chloride: 102 mmol/L (ref 98–111)
Creatinine, Ser: 1.07 mg/dL (ref 0.61–1.24)
GFR, Estimated: >60 mL/min (ref >60)
Glucose, Bld: 107 mg/dL — ABNORMAL HIGH (ref 70–99)
Potassium: 3.6 mmol/L (ref 3.5–5.1)
Sodium: 138 mmol/L (ref 135–145)

## 2024-05-08 LAB — CBC WITH DIFFERENTIAL/PLATELET
Abs Immature Granulocytes: 0.02 K/uL (ref 0.00–0.07)
Basophils Absolute: 0 K/uL (ref 0.0–0.1)
Basophils Relative: 1 %
Eosinophils Absolute: 0.1 K/uL (ref 0.0–0.5)
Eosinophils Relative: 2 %
HCT: 42.7 % (ref 39.0–52.0)
Hemoglobin: 14.7 g/dL (ref 13.0–17.0)
Immature Granulocytes: 0 %
Lymphocytes Relative: 43 %
Lymphs Abs: 2.5 K/uL (ref 0.7–4.0)
MCH: 29.5 pg (ref 26.0–34.0)
MCHC: 34.4 g/dL (ref 30.0–36.0)
MCV: 85.6 fL (ref 80.0–100.0)
Monocytes Absolute: 0.8 K/uL (ref 0.1–1.0)
Monocytes Relative: 13 %
Neutro Abs: 2.3 K/uL (ref 1.7–7.7)
Neutrophils Relative %: 41 %
Platelets: 181 K/uL (ref 150–400)
RBC: 4.99 MIL/uL (ref 4.22–5.81)
RDW: 13 % (ref 11.5–15.5)
WBC: 5.7 K/uL (ref 4.0–10.5)
nRBC: 0 % (ref 0.0–0.2)

## 2024-05-08 MED ORDER — IOHEXOL 300 MG/ML  SOLN
100.0000 mL | Freq: Once | INTRAMUSCULAR | Status: AC | PRN
  Administered 2024-05-08: 100 mL via INTRAVENOUS

## 2024-05-08 NOTE — Discharge Instructions (Signed)
 Please call [facility] for to reschedule and get a earlier appointment with Dr. Renda.  As we discussed, I would like for you to present to [facility] emergency department if you experience worsening hematuria with clots that are quarter sized or greater and are becoming more frequent or if you are having trouble urinating to the ability where you cannot actively urinate.  You can also return to [facility] emergency department for any worsening symptoms or concerns you might have.  Otherwise, follow-up with [facility].

## 2024-05-08 NOTE — ED Triage Notes (Signed)
 Pt has been having blood in urine after noticing blood in semen since Aug. Pt seen by UC last week, sent to urologist yesterday and are awaiting culture results from that.  Pt denies pain with ejaculation or urination. Pt reports blood clots being ejected with ejaculation.  Pt denies any fever, n/v, reports mild right lower back pain.

## 2024-05-08 NOTE — ED Notes (Signed)
 Pt discharged home and given discharge paperwork. Opportunities given for questions. Pt verbalizes understanding. PIV removed x1. Bethena Powell SAUNDERS , RN

## 2024-05-08 NOTE — ED Provider Notes (Signed)
 [place] EMERGENCY DEPARTMENT AT [facility] Provider Note   CSN: [id number] Arrival date & time: 05/08/24  1002     Patient presents with: Hematuria   Dalton Alvarado is a 58 y.o. male patient who presents to the emergency department today for further evaluation of blood in his ejaculate this been present since August 20.  Patient states that every time he gets an erection he gets blood tinged semen.  Sometimes the blood will persist after erection dissipates but does clear with urine.  He denies any testicle pain, scrotal pain, painful ejaculation.  He states that the last couple of times he has got erection without any sexual stimulation he has had quite a bit of grossly red blood with clots.  He does endorse some intermittent cocaine use and Viagra use.  He denies any vigorous masturbation, sickle cell disease.  No fever or chills.  Patient was seen at [facility] urology yesterday and had a normal urine sample and was told to come to the Emergency Department if problem persisted.  He had an erection and ejaculation last night after sexual intercourse and passed a lot of blood with clots.    Hematuria       Prior to Admission medications   Medication Sig Start Date End Date Taking? Authorizing Provider  Cholecalciferol (VITAMIN D3) 1.25 MG (50000 UT) CAPS Take 1 capsule by mouth once a week. 02/05/20   [provider]  cyclobenzaprine  (FLEXERIL ) 10 MG tablet Take 1 tablet (10 mg total) by mouth 3 (three) times daily as needed for muscle spasms. 02/14/20   Loetta Senior, MD  EPCLUSA 400-100 MG TABS Take 1 tablet by mouth daily. 12/29/19   [provider]  sildenafil (VIAGRA) 50 MG tablet Take 50 mg by mouth as needed for erectile dysfunction. 12/17/19   [provider]  terbinafine  (LAMISIL ) 250 MG tablet Take 1 tablet (250 mg total) by mouth daily. 08/11/22   Tobie Franky SQUIBB, DPM    Allergies: Patient has no known allergies.    Review of Systems   Genitourinary:  Positive for hematuria.  All other systems reviewed and are negative.   Updated Vital Signs BP (!) 149/94   Pulse 68   Temp 98.4 F (36.9 C)   Resp 16   Ht 6' 4 (1.93 m)   Wt 110.2 kg   SpO2 97%   BMI 29.58 kg/m   Physical Exam Vitals and nursing note reviewed. Exam conducted with a chaperone present.  Constitutional:      General: He is not in acute distress.    Appearance: Normal appearance.  HENT:     Head: Normocephalic and atraumatic.  Eyes:     General:        Right eye: No discharge.        Left eye: No discharge.  Cardiovascular:     Comments: Regular rate and rhythm.  S1/S2 are distinct without any evidence of murmur, rubs, or gallops.  Radial pulses are 2+ bilaterally.  Dorsalis pedis pulses are 2+ bilaterally.  No evidence of pedal edema. Pulmonary:     Comments: Clear to auscultation bilaterally.  Normal effort.  No respiratory distress.  No evidence of wheezes, rales, or rhonchi heard throughout. Abdominal:     General: Abdomen is flat. Bowel sounds are normal. There is no distension.     Tenderness: There is no abdominal tenderness. There is no guarding or rebound.  Genitourinary:    Comments: Rectal exam showed good rectal tone.  Prostate  was nonenlarged, nontender, and was not nodular. Musculoskeletal:        General: Normal range of motion.     Cervical back: Neck supple.  Skin:    General: Skin is warm and dry.     Findings: No rash.  Neurological:     General: No focal deficit present.     Mental Status: He is alert.  Psychiatric:        Mood and Affect: Mood normal.        Behavior: Behavior normal.     (all labs ordered are listed, but only abnormal results are displayed) Labs Reviewed  URINALYSIS, ROUTINE W REFLEX MICROSCOPIC - Abnormal; Notable for the following components:      Result Value   Hgb urine dipstick LARGE (*)    All other components within normal limits  BASIC METABOLIC PANEL WITH GFR - Abnormal; Notable  for the following components:   Glucose, Bld 107 (*)    All other components within normal limits  CBC WITH DIFFERENTIAL/PLATELET    EKG: None  Radiology: CT ABDOMEN PELVIS W CONTRAST Result Date: 05/08/2024 CLINICAL DATA:  Gross hematuria. EXAM: CT ABDOMEN AND PELVIS WITH CONTRAST TECHNIQUE: Multidetector CT imaging of the abdomen and pelvis was performed using the standard protocol following bolus administration of intravenous contrast. RADIATION DOSE REDUCTION: This exam was performed according to the departmental dose-optimization program which includes automated exposure control, adjustment of the mA and/or kV according to patient size and/or use of iterative reconstruction technique. CONTRAST:  OMNIPAQUE  IOHEXOL  300 MG/ML  SOLN COMPARISON:  None Available. FINDINGS: Lower chest: No acute abnormality. Hepatobiliary: No focal liver abnormality is seen. No gallstones, gallbladder wall thickening, or biliary dilatation. Pancreas: Unremarkable. No pancreatic ductal dilatation or surrounding inflammatory changes. Spleen: Normal in size without focal abnormality. Adrenals/Urinary Tract: Adrenal glands appear normal. Large right renal parapelvic cyst is noted. Simple cyst is seen in upper pole of right kidney is well. No definite hydronephrosis or renal obstruction. Urinary bladder is unremarkable. Stomach/Bowel: Stomach is within normal limits. Appendix appears normal. No evidence of bowel wall thickening, distention, or inflammatory changes. Vascular/Lymphatic: No significant vascular findings are present. No enlarged abdominal or pelvic lymph nodes. Reproductive: Prostate is unremarkable. Other: No abdominal wall hernia or abnormality. No abdominopelvic ascites. Musculoskeletal: No acute or significant osseous findings. IMPRESSION: 1. Large right renal parapelvic cyst is noted. No definite hydronephrosis or renal obstruction is noted. 2. No other abnormality seen in the abdomen or pelvis.  Electronically Signed   By: Lynwood Landy Raddle M.D.   On: 05/08/2024 12:49     Procedures   Medications Ordered in the ED  iohexol  (OMNIPAQUE ) 300 MG/ML solution 100 mL (100 mLs Intravenous Contrast Given 05/08/24 1145)    Clinical Course as of 05/08/24 1417  Thu May 08, 2024  1330 Urinalysis, Routine w reflex microscopic -Urine, Clean Catch(!) Evidence of hematuria.  No other signs of infection. [CF]  1330 CBC with Differential Normal. [CF]  1330 Basic metabolic panel(!) Negative. [CF]  1330 CT ABDOMEN PELVIS W CONTRAST Notified patient of right renal cyst.  No evidence of intra acute abdominal pathology.  I do agree with the radiologist interpretation. [CF]  1351 I spoke with Dr. Marci with urology who reviewed the patient's urology note.  He recommends following up in the office for a cystoscopy. [CF]    Clinical Course User Index [CF] Theotis Cameron HERO, PA-C    Medical Decision Making Khiree Bukhari is a 58 y.o. male patient who  presents to the emergency department today for further evaluation of hematuria.  No obvious structural abnormalities.  Patient still able to urinate although is passing some clots.  Given the patient's family history of prostate cancer we will get a CT scan to further assess.  Digital rectal exam was normal.  Labs are reassuring.  Hemoglobin was normal.  Patient still having some hematuria here he has been passing some clots.  1 was approximately quarter size.  Patient continues deny pain.  Patient needs a cystoscopy.  I spoke with [facility] as highlighted in ED course and he will call the office to schedule I more urgently given his family history of prostate cancer.  Vital signs are normal.  Red flag symptoms were discussed at length the patient and his significant other.  All question concerns addressed.  Strict turn precautions were discussed.  He is safe for discharge.   Amount and/or Complexity of Data Reviewed Labs: ordered.  Decision-making details documented in ED Course. Radiology: ordered. Decision-making details documented in ED Course.  Risk Prescription drug management.     Final diagnoses:  Hematuria, unspecified type    ED Discharge Orders     None          Theotis Peers [place], [place] 05/08/24 1417    Rogelia Jerilynn RAMAN, MD 05/09/24 [phone]

## 2024-08-26 ENCOUNTER — Encounter: Payer: Self-pay | Admitting: Emergency Medicine

## 2024-08-26 ENCOUNTER — Ambulatory Visit: Admission: EM | Admit: 2024-08-26 | Discharge: 2024-08-26 | Disposition: A | Discharge status: Home / Self Care

## 2024-08-26 ENCOUNTER — Other Ambulatory Visit: Payer: Self-pay

## 2024-08-26 DIAGNOSIS — J329 Chronic sinusitis, unspecified: Secondary | ICD-10-CM

## 2024-08-26 DIAGNOSIS — J40 Bronchitis, not specified as acute or chronic: Secondary | ICD-10-CM | POA: Diagnosis not present

## 2024-08-26 MED ORDER — BENZONATATE 100 MG PO CAPS: 100.0000 mg | ORAL_CAPSULE | Freq: Three times a day (TID) | ORAL | 0 refills | Status: AC

## 2024-08-26 MED ORDER — PREDNISONE 50 MG PO TABS: ORAL_TABLET | ORAL | 0 refills | Status: AC

## 2024-08-26 MED ORDER — FLUTICASONE PROPIONATE 50 MCG/ACT NA SUSP: 1.0000 | Freq: Every day | NASAL | 0 refills | Status: AC

## 2024-08-26 MED ORDER — GUAIFENESIN ER 600 MG PO TB12: 600.0000 mg | ORAL_TABLET | Freq: Two times a day (BID) | ORAL | 0 refills | Status: AC

## 2024-08-26 MED ORDER — PSEUDOEPHEDRINE HCL 30 MG PO TABS: 30.0000 mg | ORAL_TABLET | ORAL | 0 refills | Status: AC | PRN

## 2024-08-26 MED ORDER — IPRATROPIUM-ALBUTEROL 0.5-2.5 (3) MG/3ML IN SOLN: 3.0000 mL | RESPIRATORY_TRACT | 0 refills | Status: AC | PRN

## 2024-08-26 NOTE — ED Provider Notes (Signed)
 " [facility]    CSN: [id number] Arrival date & time: 08/26/24  1054      History   Chief Complaint Chief Complaint  Patient presents with   Cough    HPI Dalton Alvarado is a 59 y.o. male.   Patient presents today due to 3 weeks worth of nasal congestion, cough productive of purulent sputum, chest tightness, and worsening symptoms when laying flat.  Patient states that he has been using nebulizer treatments and OTC cold medications without significant relief.  Patient states that he is not getting much rest at night and is starting to have lower back pain due to coughing.  Patient denies fever, chills, nausea, vomiting, or change in appetite.  She denies a history of asthma or COPD but states that he does occasionally smoke Black and milds.  Patient denies ever having something similar to this before.  The history is provided by the patient.  Cough   History reviewed. No pertinent past medical history.  There are no active problems to display for this patient.   Past Surgical History:  Procedure Laterality Date   KNEE SURGERY         Home Medications    Prior to Admission medications  Medication Sig Start Date End Date Taking? Authorizing Provider  benzonatate  (TESSALON ) 100 MG capsule Take 1 capsule (100 mg total) by mouth every 8 (eight) hours. 08/26/24  Yes Andra Corean BROCKS, PA-C  fluticasone  (FLONASE ) 50 MCG/ACT nasal spray Place 1 spray into both nostrils daily. 08/26/24  Yes Andra Corean BROCKS, PA-C  guaiFENesin  (MUCINEX ) 600 MG 12 hr tablet Take 1 tablet (600 mg total) by mouth 2 (two) times daily for 10 days. 08/26/24 09/05/24 Yes Andra Corean BROCKS, PA-C  ipratropium-albuterol  (DUONEB) 0.5-2.5 (3) MG/3ML SOLN Take 3 mLs by nebulization every 4 (four) hours as needed. 08/26/24  Yes Andra Corean BROCKS, PA-C  predniSONE  (DELTASONE ) 50 MG tablet Take 1 tab po daily for 5 days 08/26/24  Yes Andra Corean BROCKS, PA-C  pseudoephedrine  (SUDAFED) 30 MG  tablet Take 1 tablet (30 mg total) by mouth every 4 (four) hours as needed for congestion. 08/26/24  Yes Andra Corean BROCKS, PA-C  Cholecalciferol (VITAMIN D3) 1.25 MG (50000 UT) CAPS Take 1 capsule by mouth once a week. 02/05/20   [provider]  cyclobenzaprine  (FLEXERIL ) 10 MG tablet Take 1 tablet (10 mg total) by mouth 3 (three) times daily as needed for muscle spasms. 02/14/20   Loetta Senior, MD  EPCLUSA 400-100 MG TABS Take 1 tablet by mouth daily. 12/29/19   [provider]  sildenafil (VIAGRA) 50 MG tablet Take 50 mg by mouth as needed for erectile dysfunction. 12/17/19   [provider]  terbinafine  (LAMISIL ) 250 MG tablet Take 1 tablet (250 mg total) by mouth daily. 08/11/22   Tobie Franky SQUIBB, DPM    Family History History reviewed. No pertinent family history.  Social History Social History[1]   Allergies   Patient has no known allergies.   Review of Systems Review of Systems  Respiratory:  Positive for cough.      Physical Exam Triage Vital Signs ED Triage Vitals  Encounter Vitals Group     BP 08/26/24 1134 134/89     Girls Systolic BP Percentile --      Girls Diastolic BP Percentile --      Boys Systolic BP Percentile --      Boys Diastolic BP Percentile --      Pulse Rate 08/26/24 1134  71     Resp 08/26/24 1134 18     Temp 08/26/24 1134 98 F (36.7 C)     Temp Source 08/26/24 1134 Oral     SpO2 08/26/24 1134 97 %     Weight --      Height --      Head Circumference --      Peak Flow --      Pain Score 08/26/24 1135 0     Pain Loc --      Pain Education --      Exclude from Growth Chart --    No data found.  Updated Vital Signs BP 134/89 (BP Location: Left Arm)   Pulse 71   Temp 98 F (36.7 C) (Oral)   Resp 18   SpO2 97%   Visual Acuity Right Eye Distance:   Left Eye Distance:   Bilateral Distance:    Right Eye Near:   Left Eye Near:    Bilateral Near:     Physical Exam Vitals and nursing note reviewed.   Constitutional:      General: He is not in acute distress.    Appearance: Normal appearance. He is not ill-appearing, toxic-appearing or diaphoretic.  HENT:     Nose: Congestion (markedly enlarged turbinates) present. No rhinorrhea.     Right Sinus: Maxillary sinus tenderness present. No frontal sinus tenderness.     Left Sinus: Maxillary sinus tenderness present. No frontal sinus tenderness.     Comments: No tenderness to palpation Eyes:     General: No scleral icterus. Cardiovascular:     Rate and Rhythm: Normal rate and regular rhythm.     Heart sounds: Normal heart sounds.  Pulmonary:     Effort: Pulmonary effort is normal. No respiratory distress.     Breath sounds: Normal breath sounds. No wheezing or rhonchi.  Skin:    General: Skin is warm.  Neurological:     Mental Status: He is alert and oriented to person, place, and time.  Psychiatric:        Mood and Affect: Mood normal.        Behavior: Behavior normal.      UC Treatments / Results  Labs (all labs ordered are listed, but only abnormal results are displayed) Labs Reviewed - No data to display  EKG   Radiology No results found.  Procedures Procedures (including critical care time)  Medications Ordered in UC Medications - No data to display  Initial Impression / Assessment and Plan / UC Course  I have reviewed the triage vital signs and the nursing notes.  Pertinent labs & imaging results that were available during my care of the patient were reviewed by me and considered in my medical decision making (see chart for details).      Final Clinical Impressions(s) / UC Diagnoses   Final diagnoses:  Sinobronchitis     Discharge Instructions      You have symptoms of viral sinusitis and acute bronchitis.  Will send duoneb, prednisone , tessalon , and mucinex  for symptoms of bronchitis  Sudafed and flonase  are for sinus symptoms.  If you develop fever 100.5 or above, loss of appetite, shortness  of breath, nausea, or significant fatigue please follow-up in clinic or with your PCP.     ED Prescriptions     Medication Sig Dispense Auth. Provider   guaiFENesin  (MUCINEX ) 600 MG 12 hr tablet Take 1 tablet (600 mg total) by mouth 2 (two) times daily for 10 days. 20  tablet Andra Krabbe C, PA-C   benzonatate  (TESSALON ) 100 MG capsule Take 1 capsule (100 mg total) by mouth every 8 (eight) hours. 30 capsule Christin Mccreedy C, PA-C   predniSONE  (DELTASONE ) 50 MG tablet Take 1 tab po daily for 5 days 5 tablet Andra Krabbe C, PA-C   fluticasone  (FLONASE ) 50 MCG/ACT nasal spray Place 1 spray into both nostrils daily. 16 g Loranzo Desha C, PA-C   pseudoephedrine  (SUDAFED) 30 MG tablet Take 1 tablet (30 mg total) by mouth every 4 (four) hours as needed for congestion. 30 tablet Andra Krabbe C, PA-C   ipratropium-albuterol  (DUONEB) 0.5-2.5 (3) MG/3ML SOLN Take 3 mLs by nebulization every 4 (four) hours as needed. 75 mL Andra Krabbe BROCKS, PA-C      PDMP not reviewed this encounter.    [1]  Social History Tobacco Use   Smoking status: Never   Smokeless tobacco: Never  Substance Use Topics   Alcohol  use: Yes   Drug use: Not Currently     Andra Krabbe BROCKS DEVONNA 08/26/24 1256  "

## 2024-08-26 NOTE — ED Triage Notes (Signed)
 Pt here for cough x 3 weeks; pt sts using OTC meds and neb machine without any improvement; denies fever

## 2024-08-26 NOTE — Discharge Instructions (Addendum)
 You have symptoms of viral sinusitis and acute bronchitis.  Will send duoneb, prednisone , tessalon , and mucinex  for symptoms of bronchitis  Sudafed and flonase  are for sinus symptoms.  If you develop fever 100.5 or above, loss of appetite, shortness of breath, nausea, or significant fatigue please follow-up in clinic or with your PCP.
